# Patient Record
Sex: Female | Born: 1995 | Race: Black or African American | Hispanic: No | Marital: Single | Smoking: Never smoker
Health system: Southern US, Community
[De-identification: ages and names within clinical notes are randomized; demographics above are authoritative.]

## PROBLEM LIST (undated history)

## (undated) DIAGNOSIS — Z8742 Personal history of other diseases of the female genital tract: Secondary | ICD-10-CM

## (undated) DIAGNOSIS — M419 Scoliosis, unspecified: Secondary | ICD-10-CM

## (undated) HISTORY — PX: NO PAST SURGERIES: SHX2092

## (undated) HISTORY — DX: Personal history of other diseases of the female genital tract: Z87.42

---

## 2018-05-23 ENCOUNTER — Encounter: Payer: Self-pay | Admitting: Emergency Medicine

## 2018-05-23 ENCOUNTER — Emergency Department
Admission: EM | Admit: 2018-05-23 | Discharge: 2018-05-23 | Disposition: A | Discharge status: Home / Self Care | Payer: Self-pay | Attending: Emergency Medicine | Admitting: Emergency Medicine

## 2018-05-23 DIAGNOSIS — N939 Abnormal uterine and vaginal bleeding, unspecified: Secondary | ICD-10-CM | POA: Insufficient documentation

## 2018-05-23 LAB — CBC WITH DIFFERENTIAL/PLATELET
BASOS ABS: 0.1 10*3/uL (ref 0–0.1)
Basophils Relative: 1 %
EOS ABS: 0.2 10*3/uL (ref 0–0.7)
EOS PCT: 3 %
HCT: 36 % (ref 35.0–47.0)
Hemoglobin: 12.2 g/dL (ref 12.0–16.0)
LYMPHS PCT: 32 %
Lymphs Abs: 2.1 10*3/uL (ref 1.0–3.6)
MCH: 30 pg (ref 26.0–34.0)
MCHC: 34 g/dL (ref 32.0–36.0)
MCV: 88.2 fL (ref 80.0–100.0)
MONO ABS: 0.6 10*3/uL (ref 0.2–0.9)
Monocytes Relative: 9 %
Neutro Abs: 3.6 10*3/uL (ref 1.4–6.5)
Neutrophils Relative %: 55 %
PLATELETS: 279 10*3/uL (ref 150–440)
RBC: 4.08 MIL/uL (ref 3.80–5.20)
RDW: 15 % — AB (ref 11.5–14.5)
WBC: 6.5 10*3/uL (ref 3.6–11.0)

## 2018-05-23 LAB — COMPREHENSIVE METABOLIC PANEL
ALBUMIN: 3.9 g/dL (ref 3.5–5.0)
ALT: 11 U/L (ref 0–44)
AST: 20 U/L (ref 15–41)
Alkaline Phosphatase: 56 U/L (ref 38–126)
Anion gap: 6 (ref 5–15)
BUN: 7 mg/dL (ref 6–20)
CALCIUM: 9 mg/dL (ref 8.9–10.3)
CHLORIDE: 106 mmol/L (ref 98–111)
CO2: 27 mmol/L (ref 22–32)
CREATININE: 0.75 mg/dL (ref 0.44–1.00)
GFR calc Af Amer: >60 mL/min (ref >60)
GFR calc non Af Amer: >60 mL/min (ref >60)
Glucose, Bld: 84 mg/dL (ref 70–99)
POTASSIUM: 3.8 mmol/L (ref 3.5–5.1)
Sodium: 139 mmol/L (ref 135–145)
TOTAL PROTEIN: 7.7 g/dL (ref 6.5–8.1)
Total Bilirubin: 0.4 mg/dL (ref 0.3–1.2)

## 2018-05-23 LAB — POCT PREGNANCY, URINE: Preg Test, Ur: NEGATIVE

## 2018-05-23 NOTE — ED Notes (Signed)
Upon reassesment of EDP, room found to be empty with no signs of patient returning.

## 2018-05-23 NOTE — ED Triage Notes (Signed)
Patient states that she started her period on Thursday. Patient states that with this period she has had back pain and has had heavier bleeding then normal.

## 2018-05-23 NOTE — ED Provider Notes (Signed)
[facility] Emergency Department Provider Note  ____________________________________________   I have reviewed the triage vital signs and the nursing notes. Where available I have reviewed prior notes and, if possible and indicated, outside hospital notes.    HISTORY  Chief Complaint Vaginal Bleeding    HPI Felicia Avila is a 22 y.o. female  States that she normally has regular menstrual cycles, her last menstrual cycle was August 28.  She states she began to have another extra duration a few days ago which is atypical for her.  She is actually active she has had no vaginal discharge.  It is about the same as a normal.  And nearly gone now but she is concerned because it is irregular.  She wants to know she is pregnant.  She is not using any birth control. Last menstruation was normal.  She has had no significant bleeding this time.  Not passing clots, used a total of 2-3 pads today.  History reviewed. No pertinent past medical history.  There are no active problems to display for this patient.   History reviewed. No pertinent surgical history.  Prior to Admission medications   Not on File    Allergies Patient has no known allergies.  No family history on file.  Social History Social History   Tobacco Use  . Smoking status: Former Research scientist (life sciences)  . Smokeless tobacco: Never Used  Substance Use Topics  . Alcohol use: Yes    Comment: occ  . Drug use: Not Currently    Review of Systems Constitutional: No fever/chills Eyes: No visual changes. ENT: No sore throat. No stiff neck no neck pain Cardiovascular: Denies chest pain. Respiratory: Denies shortness of breath. Gastrointestinal:   no vomiting.  No diarrhea.  No constipation. Genitourinary: Negative for dysuria. Musculoskeletal: Negative lower extremity swelling Skin: Negative for rash. Neurological: Negative for severe headaches, focal weakness or  numbness.   ____________________________________________   PHYSICAL EXAM:  VITAL SIGNS: ED Triage Vitals  Enc Vitals Group     BP 05/23/18 1927 (!) 130/92     Pulse Rate 05/23/18 1927 78     Resp 05/23/18 1927 18     Temp 05/23/18 1927 99.7 F (37.6 C)     Temp Source 05/23/18 1927 Oral     SpO2 05/23/18 1927 98 %     Weight 05/23/18 1928 140 lb (63.5 kg)     Height 05/23/18 1928 5\' 4"  (1.626 m)     Head Circumference --      Peak Flow --      Pain Score 05/23/18 1928 0     Pain Loc --      Pain Edu? --      Excl. in [place]? --     Constitutional: Alert and oriented. Well appearing and in no acute distress. Eyes: Conjunctivae are normal Head: Atraumatic HEENT: No congestion/rhinnorhea. Mucous membranes are moist.  Oropharynx non-erythematous Neck:   Nontender with no meningismus, no masses, no stridor Cardiovascular: Normal rate, regular rhythm. Grossly normal heart sounds.  Good peripheral circulation. Respiratory: Normal respiratory effort.  No retractions. Lungs CTAB. Abdominal: Soft and nontender. No distention. No guarding no rebound Back:  There is no focal tenderness or step off.  there is no midline tenderness there are no lesions noted. there is no CVA tenderness Patient declines Musculoskeletal: No lower extremity tenderness, no upper extremity tenderness. No joint effusions, no DVT signs strong distal pulses no edema Neurologic:  Normal speech and language. No gross focal  neurologic deficits are appreciated.  Skin:  Skin is warm, dry and intact. No rash noted. Psychiatric: Mood and affect are normal. Speech and behavior are normal.  ____________________________________________   LABS (all labs ordered are listed, but only abnormal results are displayed)  Labs Reviewed  CBC WITH DIFFERENTIAL/PLATELET - Abnormal; Notable for the following components:      Result Value   RDW 15.0 (*)    All other components within normal limits  COMPREHENSIVE METABOLIC PANEL   POC URINE PREG, ED  POCT PREGNANCY, URINE    Pertinent labs  results that were available during my care of the patient were reviewed by me and considered in my medical decision making (see chart for details). ____________________________________________  EKG  I personally interpreted any EKGs ordered by me or triage  ____________________________________________  RADIOLOGY  Pertinent labs & imaging results that were available during my care of the patient were reviewed by me and considered in my medical decision making (see chart for details). If possible, patient and/or family made aware of any abnormal findings.  No results found. ____________________________________________    PROCEDURES  Procedure(s) performed: None  Procedures  Critical Care performed: None  ____________________________________________   INITIAL IMPRESSION / ASSESSMENT AND PLAN / ED COURSE  Pertinent labs & imaging results that were available during my care of the patient were reviewed by me and considered in my medical decision making (see chart for details).  Patient here with early vaginal bleeding, hemoglobin and vital signs are reassuring, she is a told her she was not pregnant she states "that I am going to leave now".  I asked her to wait for further conversation and she eloped.  Before she left however I was able to tell her that she does need to secure some means of birth control and follow-up with OB and she voiced understanding of that.  After that, states she is found that she was not pregnant, she elected not to stay for further care and patient eloped.    ____________________________________________   FINAL CLINICAL IMPRESSION(S) / ED DIAGNOSES  Final diagnoses:  None      This chart was dictated using voice recognition software.  Despite best efforts to proofread,  errors can occur which can change meaning.      Schuyler Amor, MD 05/23/18 2041

## 2019-05-26 ENCOUNTER — Emergency Department: Payer: Self-pay

## 2019-05-26 ENCOUNTER — Other Ambulatory Visit: Payer: Self-pay

## 2019-05-26 ENCOUNTER — Encounter: Payer: Self-pay | Admitting: Emergency Medicine

## 2019-05-26 ENCOUNTER — Emergency Department
Admission: EM | Admit: 2019-05-26 | Discharge: 2019-05-26 | Disposition: A | Discharge status: Home / Self Care | Payer: Self-pay | Attending: Emergency Medicine | Admitting: Emergency Medicine

## 2019-05-26 DIAGNOSIS — Z87891 Personal history of nicotine dependence: Secondary | ICD-10-CM | POA: Insufficient documentation

## 2019-05-26 DIAGNOSIS — R1031 Right lower quadrant pain: Secondary | ICD-10-CM | POA: Insufficient documentation

## 2019-05-26 DIAGNOSIS — N83209 Unspecified ovarian cyst, unspecified side: Secondary | ICD-10-CM

## 2019-05-26 DIAGNOSIS — N83202 Unspecified ovarian cyst, left side: Secondary | ICD-10-CM

## 2019-05-26 DIAGNOSIS — N83201 Unspecified ovarian cyst, right side: Secondary | ICD-10-CM

## 2019-05-26 DIAGNOSIS — R102 Pelvic and perineal pain: Secondary | ICD-10-CM

## 2019-05-26 LAB — HCG, QUANTITATIVE, PREGNANCY: hCG, Beta Chain, Quant, S: <1 m[IU]/mL (ref <5)

## 2019-05-26 LAB — URINALYSIS, COMPLETE (UACMP) WITH MICROSCOPIC
Bacteria, UA: NONE SEEN
Bilirubin Urine: NEGATIVE
Glucose, UA: NEGATIVE mg/dL
Hgb urine dipstick: NEGATIVE
Ketones, ur: NEGATIVE mg/dL
Nitrite: NEGATIVE
Protein, ur: NEGATIVE mg/dL
Specific Gravity, Urine: 1.02 (ref 1.005–1.030)
pH: 6 (ref 5.0–8.0)

## 2019-05-26 LAB — COMPREHENSIVE METABOLIC PANEL
ALT: 12 U/L (ref 0–44)
AST: 17 U/L (ref 15–41)
Albumin: 3.9 g/dL (ref 3.5–5.0)
Alkaline Phosphatase: 58 U/L (ref 38–126)
Anion gap: 12 (ref 5–15)
BUN: 11 mg/dL (ref 6–20)
CO2: 26 mmol/L (ref 22–32)
Calcium: 8.9 mg/dL (ref 8.9–10.3)
Chloride: 101 mmol/L (ref 98–111)
Creatinine, Ser: 0.81 mg/dL (ref 0.44–1.00)
GFR calc Af Amer: >60 mL/min (ref >60)
GFR calc non Af Amer: >60 mL/min (ref >60)
Glucose, Bld: 93 mg/dL (ref 70–99)
Potassium: 3.7 mmol/L (ref 3.5–5.1)
Sodium: 139 mmol/L (ref 135–145)
Total Bilirubin: 0.4 mg/dL (ref 0.3–1.2)
Total Protein: 7.9 g/dL (ref 6.5–8.1)

## 2019-05-26 LAB — CBC
HCT: 37.1 % (ref 36.0–46.0)
Hemoglobin: 11.6 g/dL — ABNORMAL LOW (ref 12.0–15.0)
MCH: 27.7 pg (ref 26.0–34.0)
MCHC: 31.3 g/dL (ref 30.0–36.0)
MCV: 88.5 fL (ref 80.0–100.0)
Platelets: 350 10*3/uL (ref 150–400)
RBC: 4.19 MIL/uL (ref 3.87–5.11)
RDW: 14.6 % (ref 11.5–15.5)
WBC: 7.3 10*3/uL (ref 4.0–10.5)
nRBC: 0 % (ref 0.0–0.2)

## 2019-05-26 LAB — POCT PREGNANCY, URINE: Preg Test, Ur: NEGATIVE

## 2019-05-26 LAB — LIPASE, BLOOD: Lipase: 36 U/L (ref 11–51)

## 2019-05-26 MED ORDER — IBUPROFEN 800 MG PO TABS: 800.0000 mg | ORAL_TABLET | Freq: Three times a day (TID) | ORAL | 0 refills | Status: DC | PRN

## 2019-05-26 MED ORDER — ONDANSETRON HCL 4 MG/2ML IJ SOLN
4.0000 mg | Freq: Once | INTRAMUSCULAR | Status: AC
  Administered 2019-05-26: 4 mg via INTRAVENOUS
  Filled 2019-05-26: qty 2

## 2019-05-26 MED ORDER — MORPHINE SULFATE (PF) 2 MG/ML IV SOLN: 2.0000 mg | Freq: Once | INTRAVENOUS | Status: DC

## 2019-05-26 MED ORDER — IOHEXOL 300 MG/ML  SOLN
75.0000 mL | Freq: Once | INTRAMUSCULAR | Status: AC | PRN
  Administered 2019-05-26: 75 mL via INTRAVENOUS

## 2019-05-26 MED ORDER — MORPHINE SULFATE (PF) 4 MG/ML IV SOLN
4.0000 mg | Freq: Once | INTRAVENOUS | Status: AC
  Administered 2019-05-26: 4 mg via INTRAVENOUS
  Filled 2019-05-26: qty 1

## 2019-05-26 NOTE — ED Triage Notes (Signed)
Pt reports RLQ abd pain since 8 this am. Pt denies NVD. Pt reports the pain is sharp and constant. Pt reports pain increases with deep breaths. Pt denies fevers.

## 2019-05-26 NOTE — ED Notes (Signed)
Pt states that abdominal pain started this morning around 0800, states pain is located on lower right side of abdomen. Describes the pain as a sharp pain. Denies NVD.

## 2019-05-26 NOTE — ED Provider Notes (Signed)
[facility] Emergency Department Provider Note  ____________________________________________   First MD Initiated Contact with Patient 05/26/19 1043     (approximate)  I have reviewed the triage vital signs and the nursing notes.   HISTORY  Chief Complaint Abdominal Pain  HPI Felicia Avila is a 23 y.o. female who presents to the ER for right lower quadrant abdominal pain since 8:00 this morning.  She denies nausea, vomiting or diarrhea.  Patient reports the pain is sharp and constant and worsens with a deep breath.  Pain is 5 out of 10.     History reviewed. No pertinent past medical history.  There are no active problems to display for this patient.   History reviewed. No pertinent surgical history.  Prior to Admission medications   Not on File    Allergies Patient has no known allergies.  No family history on file.  Social History Social History   Tobacco Use  . Smoking status: Former Research scientist (life sciences)  . Smokeless tobacco: Never Used  Substance Use Topics  . Alcohol use: Yes    Comment: occ  . Drug use: Not Currently    Review of Systems  Constitutional: No fever/chills Cardiovascular: Denies chest pain. Respiratory: Denies shortness of breath. Gastrointestinal: Positive for abdominal pain Genitourinary: Negative for dysuria. Musculoskeletal: Negative for back pain. Skin: Negative for rash. Neurological: Negative for headaches, focal weakness or numbness.  ____________________________________________   PHYSICAL EXAM:  VITAL SIGNS: ED Triage Vitals  Enc Vitals Group     BP 05/26/19 1010 (!) 124/91     Pulse Rate 05/26/19 1010 73     Resp 05/26/19 1010 20     Temp 05/26/19 1010 99.1 F (37.3 C)     Temp Source 05/26/19 1010 Oral     SpO2 05/26/19 1010 99 %     Weight 05/26/19 1007 136 lb (61.7 kg)     Height 05/26/19 1007 5\' 4"  (1.626 m)     Head Circumference --      Peak Flow --      Pain Score 05/26/19 1007 5   Pain Loc --      Pain Edu? --      Excl. in [place]? --    Constitutional: Alert and oriented. Well appearing and in no acute distress. Eyes: Conjunctivae are normal. PERRL. EOMI. Head: Atraumatic. Nose: No congestion/rhinnorhea. Mouth/Throat: Mucous membranes are moist.  Oropharynx non-erythematous. Neck: No stridor.   Cardiovascular: Normal rate, regular rhythm. Grossly normal heart sounds.  Good peripheral circulation. Respiratory: Normal respiratory effort.  No retractions. Lungs CTAB. Gastrointestinal: Soft and nontender. No distention. No abdominal bruits. No CVA tenderness. Musculoskeletal: No lower extremity tenderness nor edema.  No joint effusions. Neurologic:  Normal speech and language. No gross focal neurologic deficits are appreciated. No gait instability. Skin:  Skin is warm, dry and intact. No rash noted. Psychiatric: Mood and affect are normal. Speech and behavior are normal.  ____________________________________________   LABS (all labs ordered are listed, but only abnormal results are displayed)  Labs Reviewed  CBC - Abnormal; Notable for the following components:      Result Value   Hemoglobin 11.6 (*)    All other components within normal limits  URINALYSIS, COMPLETE (UACMP) WITH MICROSCOPIC - Abnormal; Notable for the following components:   Color, Urine YELLOW (*)    APPearance HAZY (*)    Leukocytes,Ua SMALL (*)    All other components within normal limits  CHLAMYDIA/NGC RT PCR ([facility] ONLY)  LIPASE, BLOOD  COMPREHENSIVE METABOLIC PANEL  POC URINE PREG, ED  POCT PREGNANCY, URINE   ___________________________________________  RADIOLOGY  Official radiology report(s): No results found. CT the abdomen pelvis with contrast  IMPRESSION:  1. Recently collapsed right ovarian cyst. Fluid tracks from the  right adnexa along the rightward aspect of the upper cul-de-sac.   2. Suspect recent cyst/follicle leakage on the left with fluid  tracking from  an apparent follicle on the left into the leftward  aspect of the cul-de-sac.   3. Appendix appears normal. No evident abscess in the abdomen or  pelvis. No bowel obstruction.   4. No evident renal or ureteral calculus. No hydronephrosis. Urinary  bladder wall thickness within normal limits.  Pelvic US  IMPRESSION:  1. Negative for adnexal torsion.  2. Multiple mildly prominent right ovarian cysts versus follicles  with a small amount of adjacent mildly complex free fluid. Findings  suggestive of a recently ruptured hemorrhagic cyst. No specific  imaging follow-up of this finding is recommended.  ____________________________________________   INITIAL IMPRESSION / ASSESSMENT AND PLAN / ED COURSE Appendicitis, renal colic, UTI, pyelonephritis, ovarian cyst, torsion   ____________________________________________   FINAL CLINICAL IMPRESSION(S) / ED DIAGNOSES  Abdominal pain  Plan: Patient presented for right lower quadrant abdominal pain, labs and initial work-up was unremarkable.  CT of the abdomen pelvis was performed as well as an ultrasound which was indicative of a ruptured ovarian cyst.  I have discussed with GYN, Dr. Gilman Schmidt, she is cleared for outpatient follow-up.      Note:  This document was prepared using Dragon voice recognition software and may include unintentional dictation errors.    Earleen Newport, MD 05/26/19 [phone]

## 2019-06-16 ENCOUNTER — Ambulatory Visit (LOCAL_COMMUNITY_HEALTH_CENTER): Payer: Self-pay

## 2019-06-16 ENCOUNTER — Other Ambulatory Visit: Payer: Self-pay

## 2019-06-16 VITALS — Ht 63.5 in

## 2019-06-16 DIAGNOSIS — Z3202 Encounter for pregnancy test, result negative: Secondary | ICD-10-CM

## 2019-06-16 LAB — PREGNANCY, URINE: Preg Test, Ur: NEGATIVE

## 2019-06-16 MED ORDER — MULTIVITAMINS PO CAPS: 1.0000 | ORAL_CAPSULE | Freq: Every day | ORAL | 0 refills | Status: DC

## 2019-09-11 ENCOUNTER — Other Ambulatory Visit: Payer: Self-pay

## 2019-09-11 ENCOUNTER — Emergency Department
Admission: EM | Admit: 2019-09-11 | Discharge: 2019-09-11 | Disposition: A | Discharge status: Home / Self Care | Payer: Self-pay | Attending: Emergency Medicine | Admitting: Emergency Medicine

## 2019-09-11 ENCOUNTER — Emergency Department: Payer: Self-pay

## 2019-09-11 ENCOUNTER — Encounter: Payer: Self-pay | Admitting: Emergency Medicine

## 2019-09-11 DIAGNOSIS — Z87891 Personal history of nicotine dependence: Secondary | ICD-10-CM | POA: Insufficient documentation

## 2019-09-11 DIAGNOSIS — X500XXA Overexertion from strenuous movement or load, initial encounter: Secondary | ICD-10-CM | POA: Insufficient documentation

## 2019-09-11 DIAGNOSIS — Y9289 Other specified places as the place of occurrence of the external cause: Secondary | ICD-10-CM | POA: Insufficient documentation

## 2019-09-11 DIAGNOSIS — M545 Low back pain, unspecified: Secondary | ICD-10-CM

## 2019-09-11 DIAGNOSIS — Y9389 Activity, other specified: Secondary | ICD-10-CM | POA: Insufficient documentation

## 2019-09-11 DIAGNOSIS — N390 Urinary tract infection, site not specified: Secondary | ICD-10-CM | POA: Insufficient documentation

## 2019-09-11 LAB — URINALYSIS, COMPLETE (UACMP) WITH MICROSCOPIC
Bacteria, UA: NONE SEEN
Bilirubin Urine: NEGATIVE
Glucose, UA: NEGATIVE mg/dL
Hgb urine dipstick: NEGATIVE
Ketones, ur: NEGATIVE mg/dL
Nitrite: NEGATIVE
Protein, ur: NEGATIVE mg/dL
Specific Gravity, Urine: 1.02 (ref 1.005–1.030)
pH: 6 (ref 5.0–8.0)

## 2019-09-11 LAB — POCT PREGNANCY, URINE: Preg Test, Ur: NEGATIVE

## 2019-09-11 MED ORDER — CEPHALEXIN 500 MG PO CAPS: 500.0000 mg | ORAL_CAPSULE | Freq: Three times a day (TID) | ORAL | 0 refills | Status: DC

## 2019-09-11 MED ORDER — NAPROXEN 500 MG PO TABS: 500.0000 mg | ORAL_TABLET | Freq: Two times a day (BID) | ORAL | 0 refills | Status: DC

## 2019-09-11 NOTE — Discharge Instructions (Signed)
Follow-up with your primary care provider if any continued problems.  Begin taking the antibiotic that was sent to your pharmacy.  Take the entire course of antibiotics and drink lots of fluids.  You may take Tylenol or ibuprofen as needed for back pain as the urinary tract infection is most likely the source of your back pain.  Turn to the emergency department if any sudden changes such as nausea and vomiting or inability to take the antibiotic.

## 2019-09-11 NOTE — ED Notes (Signed)
See triage note  Presents with a 2 month hx of back pain  States pain is to center of back  Moving down spine  Denies any injury   Ambulates well to treatment room

## 2019-09-11 NOTE — ED Triage Notes (Signed)
Here for generalized back pain X 2 months. Worse when laying flat.  Was sleeping on broken mattress until recently.  Also lifts heavy boxes at work. NAD. Ambulatory. Denies urinary sx

## 2019-09-11 NOTE — ED Provider Notes (Signed)
[facility] Emergency Department Provider Note   ____________________________________________   First MD Initiated Contact with Patient 09/11/19 1054     (approximate)  I have reviewed the triage vital signs and the nursing notes.   HISTORY  Chief Complaint Back Pain  HPI Felicia Avila is a 24 y.o. female presents to the ED with complaint of low back pain for 2 months.  Patient states that pain is in the center of her back.  She denies any paresthesias or incontinence of bowel or bladder.  She denies any injury to her back.  She states that she has been sleeping on "a broken mattress" which she believes is made her back much worse.  She also lifts heavy boxes at work.  She denies any urinary symptoms or previous back injuries.  She has been taking ibuprofen infrequently.  Currently she rates her pain as a 4 out of 10.       Past Medical History:  Diagnosis Date   Hx of ovarian cyst     There are no problems to display for this patient.   History reviewed. No pertinent surgical history.  Prior to Admission medications   Medication Sig Start Date End Date Taking? Authorizing Provider  cephALEXin (KEFLEX) 500 MG capsule Take 1 capsule (500 mg total) by mouth 3 (three) times daily. 09/11/19   Johnn Hai, PA-C  naproxen (NAPROSYN) 500 MG tablet Take 1 tablet (500 mg total) by mouth 2 (two) times daily with a meal. 09/11/19   Johnn Hai, PA-C    Allergies Patient has no known allergies.  History reviewed. No pertinent family history.  Social History Social History   Tobacco Use   Smoking status: Former Smoker   Smokeless tobacco: Never Used  Substance Use Topics   Alcohol use: Yes    Comment: occasionally   Drug use: Never    Review of Systems Constitutional: No fever/chills Cardiovascular: Denies chest pain. Respiratory: Denies shortness of breath. Gastrointestinal: No abdominal pain.  No nausea, no vomiting.    Genitourinary: Negative for dysuria. Musculoskeletal: Positive for low back pain. Skin: Negative for rash. Neurological: Negative for headaches, focal weakness or numbness. ____________________________________________   PHYSICAL EXAM:  VITAL SIGNS: ED Triage Vitals  Enc Vitals Group     BP 09/11/19 1037 124/75     Pulse Rate 09/11/19 1037 73     Resp 09/11/19 1037 20     Temp 09/11/19 1037 98 F (36.7 C)     Temp Source 09/11/19 1037 Oral     SpO2 09/11/19 1037 100 %     Weight 09/11/19 1037 153 lb (69.4 kg)     Height 09/11/19 1037 5' 3.5" (1.613 m)     Head Circumference --      Peak Flow --      Pain Score 09/11/19 1041 4     Pain Loc --      Pain Edu? --      Excl. in [place]? --    Constitutional: Alert and oriented. Well appearing and in no acute distress. Eyes: Conjunctivae are normal.  Head: Atraumatic. Neck: No stridor.   Cardiovascular: Normal rate, regular rhythm. Grossly normal heart sounds.  Good peripheral circulation. Respiratory: Normal respiratory effort.  No retractions. Lungs CTAB. Gastrointestinal: Soft and nontender. No distention. Musculoskeletal: Examination of the back there is no gross deformity and no evidence of injury.  Patient has guarded movement secondary to pain.  She is generalized tenderness bilateral lumbar area.  There is some generalized point tenderness on palpation of the lumbar spine without step-offs.  Good muscle strength bilaterally. Neurologic:  Normal speech and language. No gross focal neurologic deficits are appreciated.  Reflexes 2+ bilaterally.  No gait instability. Skin:  Skin is warm, dry and intact. No rash noted. Psychiatric: Mood and affect are normal. Speech and behavior are normal.  ____________________________________________   LABS (all labs ordered are listed, but only abnormal results are displayed)  Labs Reviewed  URINALYSIS, COMPLETE (UACMP) WITH MICROSCOPIC - Abnormal; Notable for the following components:       Result Value   Color, Urine YELLOW (*)    APPearance HAZY (*)    Leukocytes,Ua MODERATE (*)    All other components within normal limits  POC URINE PREG, ED  POCT PREGNANCY, URINE    RADIOLOGY  Official radiology report(s): DG Lumbar Spine 2-3 Views  Result Date: 09/11/2019 CLINICAL DATA:  Pain for approximately 2 months EXAM: LUMBAR SPINE - 2-3 VIEW COMPARISON:  None. FINDINGS: Frontal, lateral, and spot lumbosacral lateral images were obtained. There are 5 non-rib-bearing lumbar type vertebral bodies. There is slight lower lumbar levoscoliosis. No fracture or spondylolisthesis. Disc spaces appear normal. There is spina bifida occulta at L1. IMPRESSION: Slight lower lumbar levoscoliosis. Spina bifida occulta at L1. No fracture or spondylolisthesis. No appreciable arthropathy. Electronically Signed   By: Lowella Grip III M.D.   On: 09/11/2019 13:47    ____________________________________________   PROCEDURES  Procedure(s) performed (including Critical Care):  Procedures   ____________________________________________   INITIAL IMPRESSION / ASSESSMENT AND PLAN / ED COURSE  As part of my medical decision making, I reviewed the following data within the electronic MEDICAL RECORD NUMBER Notes from prior ED visits and [place] Controlled Substance Database  Felicia Avila was evaluated in Emergency Department on 09/11/2019 for the symptoms described in the history of present illness. She was evaluated in the context of the global COVID-19 pandemic, which necessitated consideration that the patient might be at risk for infection with the SARS-CoV-2 virus that causes COVID-19. Institutional protocols and algorithms that pertain to the evaluation of patients at risk for COVID-19 are in a state of rapid change based on information released by regulatory bodies including the CDC and federal and state organizations. These policies and algorithms were followed during the patient's care in the  ED.  24 year old female presents to the ED with complaint of 18-month back pain that she attributes to sleeping on a "broken mattress" and lifting heavy boxes at work.  She denies any urinary symptoms.  She has not been take any over-the-counter medication that gives her relief.  Urinalysis was obtained to rule out pregnancy prior to x-ray however urine was cloudy and sent to the lab.  Patient has a urinary tract infection and was made aware.  We also discussed findings of her x-ray.  Patient is to increase fluids.  A prescription for naproxen 500 mg twice daily with food and Keflex 500 mg 3 times daily was written to treat her urinary tract infection and back pain.  Patient is to follow-up with her PCP or return to the emergency department if any severe worsening of her symptoms.  ____________________________________________   FINAL CLINICAL IMPRESSION(S) / ED DIAGNOSES  Final diagnoses:  Acute urinary tract infection  Acute bilateral low back pain without sciatica     ED Discharge Orders         Ordered    cephALEXin (KEFLEX) 500 MG capsule  3 times daily  09/11/19 1446    naproxen (NAPROSYN) 500 MG tablet  2 times daily with meals     09/11/19 1446           Note:  This document was prepared using Dragon voice recognition software and may include unintentional dictation errors.    Johnn Hai, PA-C 09/11/19 1533    Harvest Dark, MD 09/11/19 [phone]

## 2020-01-04 ENCOUNTER — Encounter: Payer: Self-pay | Admitting: Emergency Medicine

## 2020-01-04 ENCOUNTER — Other Ambulatory Visit: Payer: Self-pay

## 2020-01-04 ENCOUNTER — Emergency Department
Admission: EM | Admit: 2020-01-04 | Discharge: 2020-01-04 | Disposition: A | Discharge status: Home / Self Care | Payer: Self-pay | Attending: Emergency Medicine | Admitting: Emergency Medicine

## 2020-01-04 DIAGNOSIS — J0100 Acute maxillary sinusitis, unspecified: Secondary | ICD-10-CM | POA: Insufficient documentation

## 2020-01-04 DIAGNOSIS — I889 Nonspecific lymphadenitis, unspecified: Secondary | ICD-10-CM | POA: Insufficient documentation

## 2020-01-04 DIAGNOSIS — Z87891 Personal history of nicotine dependence: Secondary | ICD-10-CM | POA: Insufficient documentation

## 2020-01-04 MED ORDER — AMOXICILLIN 500 MG PO CAPS: 500.0000 mg | ORAL_CAPSULE | Freq: Three times a day (TID) | ORAL | 0 refills | Status: DC

## 2020-01-04 NOTE — ED Provider Notes (Signed)
[facility] Emergency Department Provider Note  ____________________________________________   First MD Initiated Contact with Patient 01/04/20 1524     (approximate)  I have reviewed the triage vital signs and the nursing notes.   HISTORY  Chief Complaint Otalgia    HPI Felicia Avila is a 24 y.o. female presents emergency department complaint of pain behind the right ear for approximately 2 years.  Symptoms have worsened over the last few days and patient also has sinus congestion.  No fever or chills.  No pus or drainage from the site.  No headache.    Past Medical History:  Diagnosis Date  . Hx of ovarian cyst     There are no problems to display for this patient.   History reviewed. No pertinent surgical history.  Prior to Admission medications   Medication Sig Start Date End Date Taking? Authorizing Provider  amoxicillin (AMOXIL) 500 MG capsule Take 1 capsule (500 mg total) by mouth 3 (three) times daily. 01/04/20   Versie Starks, PA-C    Allergies Patient has no known allergies.  History reviewed. No pertinent family history.  Social History Social History   Tobacco Use  . Smoking status: Former Research scientist (life sciences)  . Smokeless tobacco: Never Used  Substance Use Topics  . Alcohol use: Yes    Comment: occasionally  . Drug use: Never    Review of Systems  Constitutional: No fever/chills Eyes: No visual changes. ENT: No sore throat.  Positive swollen area behind the right ear Respiratory: Denies cough Cardiovascular: Denies chest pain Gastrointestinal: Denies abdominal pain Genitourinary: Negative for dysuria. Musculoskeletal: Negative for back pain. Skin: Negative for rash. Psychiatric: no mood changes,     ____________________________________________   PHYSICAL EXAM:  VITAL SIGNS: ED Triage Vitals  Enc Vitals Group     BP 01/04/20 1452 121/77     Pulse Rate 01/04/20 1452 61     Resp 01/04/20 1452 18     Temp  01/04/20 1452 98.5 F (36.9 C)     Temp Source 01/04/20 1452 Oral     SpO2 01/04/20 1452 94 %     Weight 01/04/20 1452 146 lb (66.2 kg)     Height 01/04/20 1452 5\' 3"  (1.6 m)     Head Circumference --      Peak Flow --      Pain Score 01/04/20 1511 0     Pain Loc --      Pain Edu? --      Excl. in [place]? --     Constitutional: Alert and oriented. Well appearing and in no acute distress. Eyes: Conjunctivae are normal.  Head: Atraumatic. Ears: Postauricular nodes noted, TMs clear bilaterally,  Nose: Positive congestion/rhinnorhea. Mouth/Throat: Mucous membranes are moist.   Neck:  supple no lymphadenopathy noted Cardiovascular: Normal rate, regular rhythm. Heart sounds are normal Respiratory: Normal respiratory effort.  No retractions, lungs c t a  GU: deferred Musculoskeletal: FROM all extremities, warm and well perfused Neurologic:  Normal speech and language.  Skin:  Skin is warm, dry and intact. No rash noted. Psychiatric: Mood and affect are normal. Speech and behavior are normal.  ____________________________________________   LABS (all labs ordered are listed, but only abnormal results are displayed)  Labs Reviewed - No data to display ____________________________________________   ____________________________________________  RADIOLOGY    ____________________________________________   PROCEDURES  Procedure(s) performed: No  Procedures    ____________________________________________   INITIAL IMPRESSION / ASSESSMENT AND PLAN / ED COURSE  Pertinent  labs & imaging results that were available during my care of the patient were reviewed by me and considered in my medical decision making (see chart for details).   Patient is 23 year old female presents emergency department with complaints of swollen area behind the right ear along with sinus pressure.  See HPI  Physical exam shows patient to appear well.  The swollen area behind her ear is a swollen lymph  node, the patient also has a cartilage piercing that is rubbing against the area.  No mastoid tenderness.  Remainder the exam is unremarkable  Did explain the findings to the patient.  Due to it being a singular lymph nodes as well I do feel that is due to the sinus drainage and piercing.  She was given a prescription for amoxicillin.  She is to follow-up with ENT if the lymph node does not decrease in side after taking antibiotic.  Return emergency department worsening.  Is given a work note was discharged stable condition.    Felicia Avila was evaluated in Emergency Department on 01/04/2020 for the symptoms described in the history of present illness. She was evaluated in the context of the global COVID-19 pandemic, which necessitated consideration that the patient might be at risk for infection with the SARS-CoV-2 virus that causes COVID-19. Institutional protocols and algorithms that pertain to the evaluation of patients at risk for COVID-19 are in a state of rapid change based on information released by regulatory bodies including the CDC and federal and state organizations. These policies and algorithms were followed during the patient's care in the ED.   As part of my medical decision making, I reviewed the following data within the [place] notes reviewed and incorporated, Old chart reviewed, Notes from prior ED visits and [place] Controlled Substance Database  ____________________________________________   FINAL CLINICAL IMPRESSION(S) / ED DIAGNOSES  Final diagnoses:  Acute maxillary sinusitis, recurrence not specified  Lymphadenitis      NEW MEDICATIONS STARTED DURING THIS VISIT:  New Prescriptions   AMOXICILLIN (AMOXIL) 500 MG CAPSULE    Take 1 capsule (500 mg total) by mouth 3 (three) times daily.     Note:  This document was prepared using Dragon voice recognition software and may include unintentional dictation errors.    Versie Starks, PA-C  01/04/20 1551    Vanessa Chackbay, MD 01/04/20 [phone]

## 2020-01-04 NOTE — ED Triage Notes (Signed)
Pt from home via POV. St intermittent sharp pain behind right ear for over two years but pain has gotten worse over the last few days. St congested/HA since Saturday. Taken sudafed OTC with some relief.  Denies fever. NAD noted at this time.

## 2020-01-04 NOTE — ED Notes (Signed)
Pt reports pain behind the right ear x 2 years. Pt reports pain intermittent and severe. Pt denies any injury or pain in the inner ear. Pt does have congestion at the present time.

## 2020-01-04 NOTE — Discharge Instructions (Signed)
Follow-up with your regular doctor if not improving to 3 days.  Return emergency department worsening.  If the lymph node does not decrease in size after the antibiotics please call ENT for an appointment.

## 2020-02-01 IMAGING — CT CT ABD-PELV W/ CM
2 of 4 series · 15 of 46 positions shown, 17 images · IV contrast (APPLIED)
Comparison: None.

CLINICAL DATA: Right-sided abdominal pain

EXAM:
CT ABDOMEN AND PELVIS WITH CONTRAST
TECHNIQUE: Multidetector CT imaging of the abdomen and pelvis was performed
using the standard protocol following bolus administration of
intravenous contrast.
CONTRAST:  75mL OMNIPAQUE IOHEXOL 300 MG/ML  SOLN

[Series 2: axial st · axial · 0.59mm/px · z∈[-787,-432]mm · 12 of 82 slices shown, 14 images]
[im 7/82  soft-tissue]
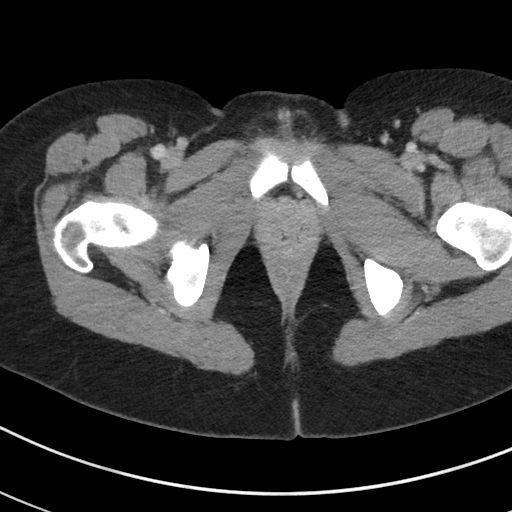
[im 7/82  bone]
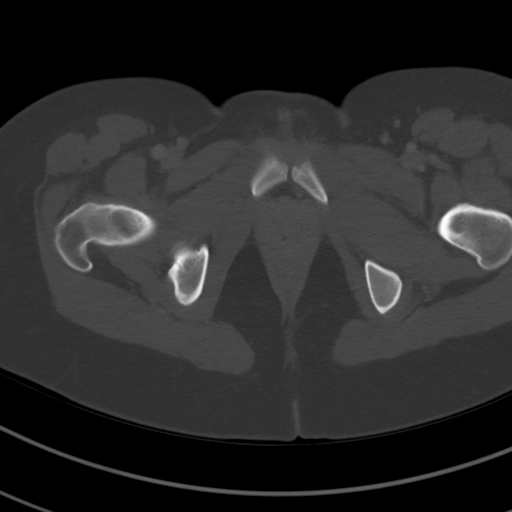
[im 13/82  soft-tissue]
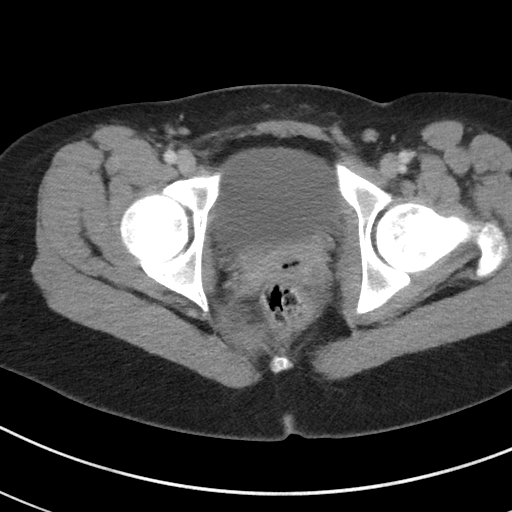
[im 20/82  soft-tissue]
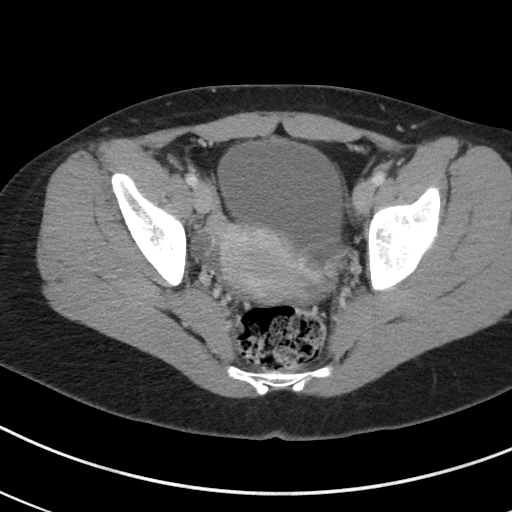
[im 26/82  soft-tissue]
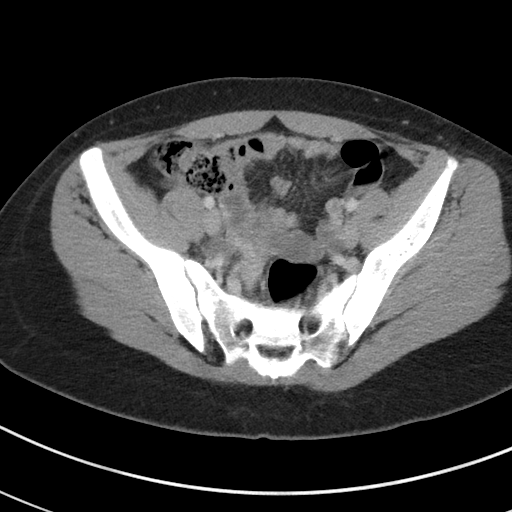
[im 33/82  soft-tissue]
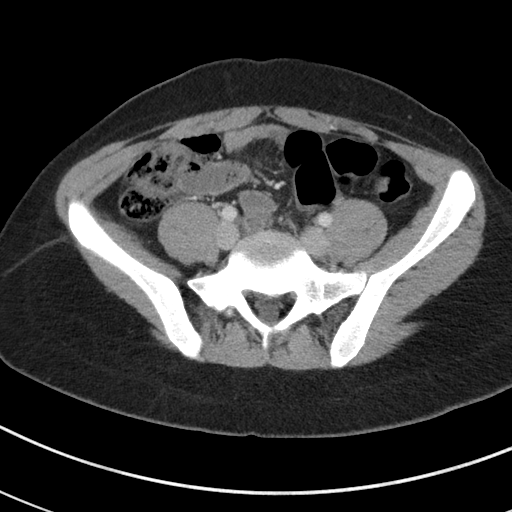
[im 39/82  soft-tissue]
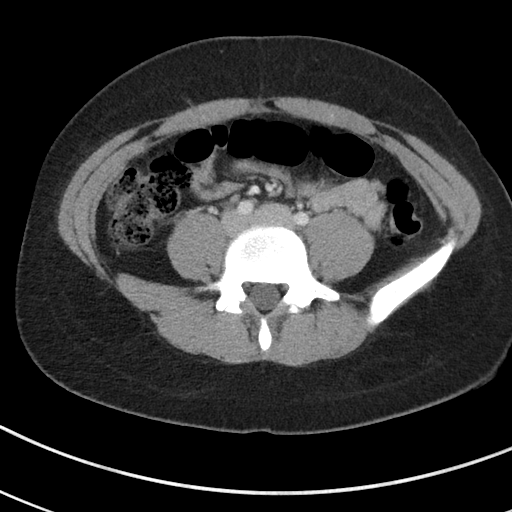
[im 46/82  soft-tissue]
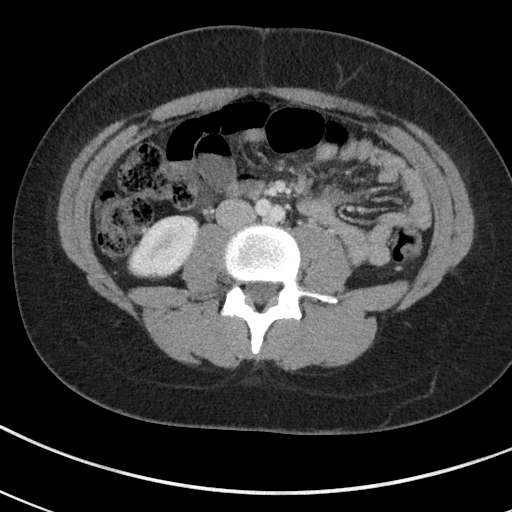
[im 52/82  soft-tissue]
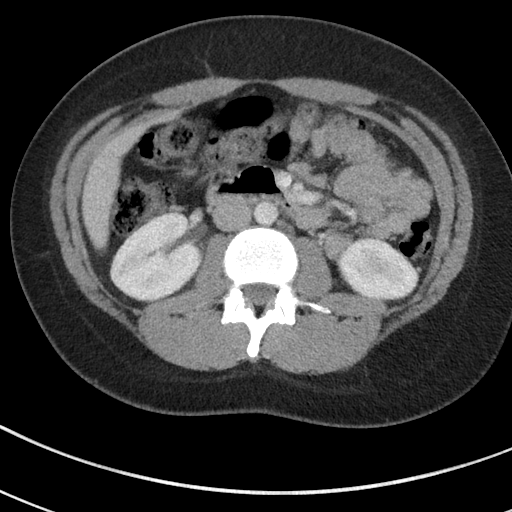
[im 59/82  soft-tissue]
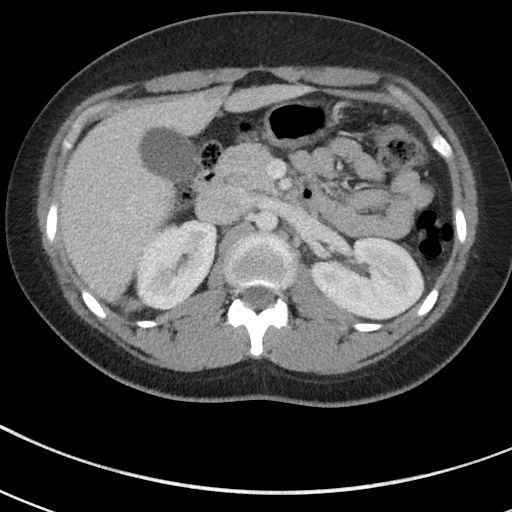
[im 59/82  bone]
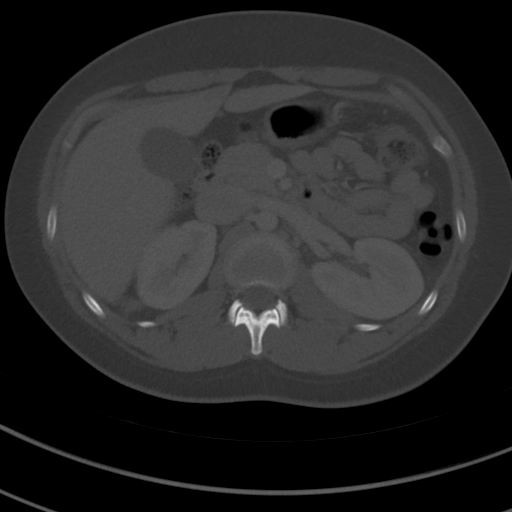
[im 65/82  soft-tissue]
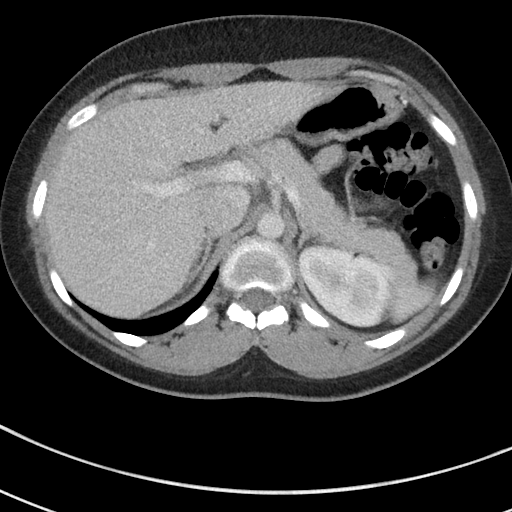
[im 72/82  soft-tissue]
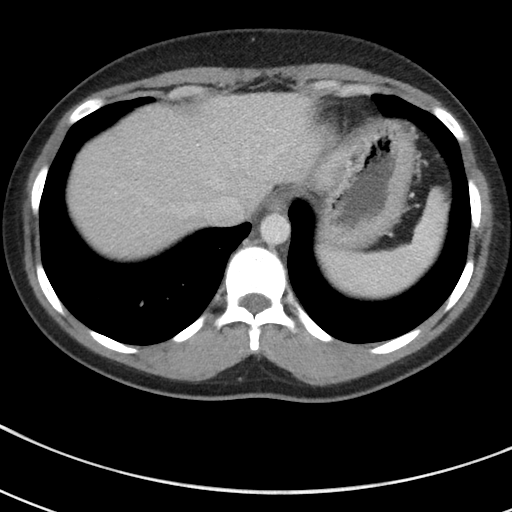
[im 78/82  soft-tissue]
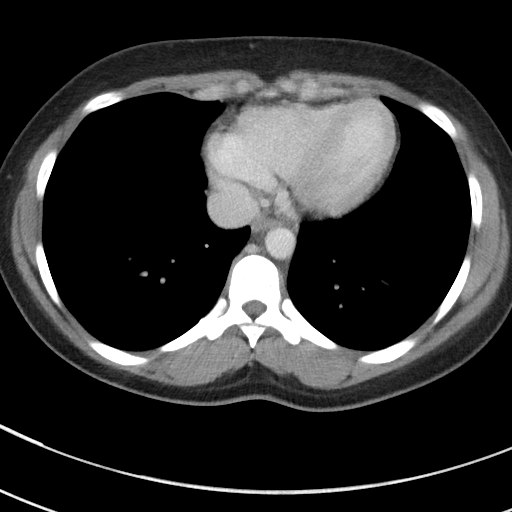

[Series 5: coronal st · coronal · 0.68mm/px · 3 of 86 slices shown]
[im 29/86  soft-tissue]
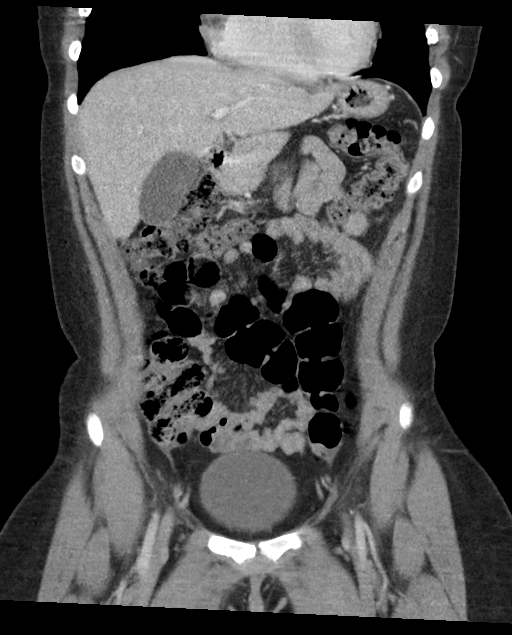
[im 38/86  soft-tissue]
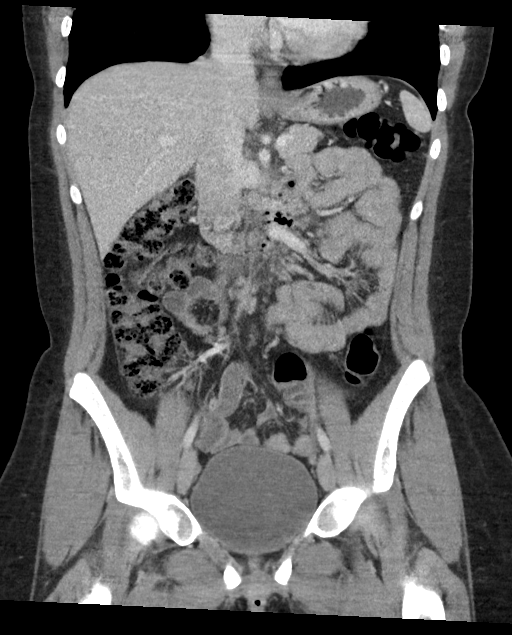
[im 48/86  soft-tissue]
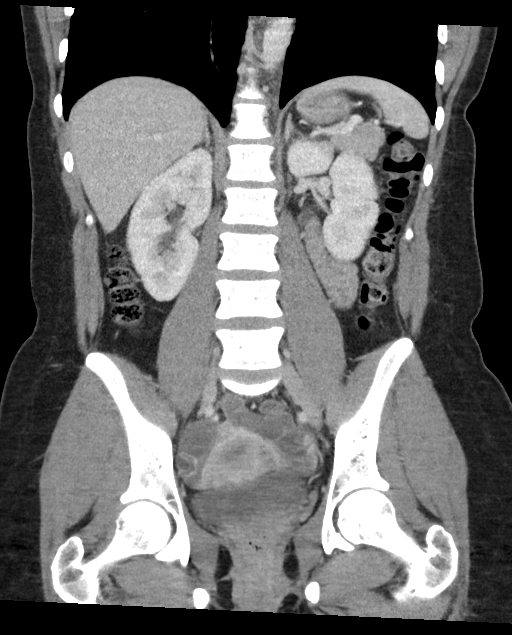

[15 of 46 positions shown; findings below may reference images not displayed]

FINDINGS: Lower chest: Lung bases are clear.

Hepatobiliary: No focal liver lesions are evident. Gallbladder wall
is not appreciably thickened. There is no biliary duct dilatation.

Pancreas: There is no pancreatic mass or inflammatory focus.

Spleen: No splenic lesions are evident.

Adrenals/Urinary Tract: Adrenals bilaterally appear unremarkable.
Kidneys bilaterally show no evident mass or hydronephrosis on either
side. There is no renal or ureteral calculus on either side. Urinary
bladder is midline with wall thickness within normal limits.

Stomach/Bowel: There is no appreciable bowel wall or mesenteric
thickening. There is no evident bowel obstruction. Terminal ileum
appears unremarkable. There is no demonstrable free air or portal
venous air.

Vascular/Lymphatic: There is no abdominal aortic aneurysm. No
vascular lesions are evident in the abdomen or pelvis. Major venous
structures appear patent. There is no adenopathy in the abdomen or
pelvis.

Reproductive: The uterus is anteverted. There is evidence of a
collapsed right ovarian cyst. This cyst shows peripheral wall
enhancement consistent with the recent collapse. It measures 1.8 x
1.6 cm in size. There is fluid surrounding this collapsed cyst with
fluid tracking from the right adnexa inferiorly. There is a presumed
follicle in the left ovary measuring 1.9 x 1.9 cm. There is a small
amount of fluid immediately adjacent to this cyst tracking into the
leftward aspect of the cul-de-sac, suggesting recent cyst leakage on
the left as well.

Other: Appendix appears normal. No periappendiceal region
inflammation. No evident abscess in the abdomen or pelvis. No
ascites beyond the mild fluid in the cul-de-sac region.

Musculoskeletal: No blastic or lytic bone lesions. No intramuscular
or abdominal wall lesions are evident.
IMPRESSION: 1. Recently collapsed right ovarian cyst. Fluid tracks from the
right adnexa along the rightward aspect of the upper cul-de-sac.

2. Suspect recent cyst/follicle leakage on the left with fluid
tracking from an apparent follicle on the left into the leftward
aspect of the cul-de-sac.

3. Appendix appears normal. No evident abscess in the abdomen or
pelvis. No bowel obstruction.

4. No evident renal or ureteral calculus. No hydronephrosis. Urinary
bladder wall thickness within normal limits.

## 2020-03-10 ENCOUNTER — Other Ambulatory Visit: Payer: Self-pay

## 2020-03-10 ENCOUNTER — Emergency Department: Payer: Self-pay

## 2020-03-10 ENCOUNTER — Emergency Department
Admission: EM | Admit: 2020-03-10 | Discharge: 2020-03-10 | Disposition: A | Discharge status: Home / Self Care | Payer: Self-pay | Attending: Emergency Medicine | Admitting: Emergency Medicine

## 2020-03-10 ENCOUNTER — Encounter: Payer: Self-pay | Admitting: Emergency Medicine

## 2020-03-10 DIAGNOSIS — R0981 Nasal congestion: Secondary | ICD-10-CM | POA: Insufficient documentation

## 2020-03-10 DIAGNOSIS — Z87891 Personal history of nicotine dependence: Secondary | ICD-10-CM | POA: Insufficient documentation

## 2020-03-10 DIAGNOSIS — N938 Other specified abnormal uterine and vaginal bleeding: Secondary | ICD-10-CM

## 2020-03-10 DIAGNOSIS — N939 Abnormal uterine and vaginal bleeding, unspecified: Secondary | ICD-10-CM | POA: Insufficient documentation

## 2020-03-10 DIAGNOSIS — R0789 Other chest pain: Secondary | ICD-10-CM | POA: Insufficient documentation

## 2020-03-10 DIAGNOSIS — R0602 Shortness of breath: Secondary | ICD-10-CM | POA: Insufficient documentation

## 2020-03-10 DIAGNOSIS — R05 Cough: Secondary | ICD-10-CM | POA: Insufficient documentation

## 2020-03-10 DIAGNOSIS — R079 Chest pain, unspecified: Secondary | ICD-10-CM

## 2020-03-10 LAB — CBC
HCT: 36 % (ref 36.0–46.0)
Hemoglobin: 11.5 g/dL — ABNORMAL LOW (ref 12.0–15.0)
MCH: 27.8 pg (ref 26.0–34.0)
MCHC: 31.9 g/dL (ref 30.0–36.0)
MCV: 87 fL (ref 80.0–100.0)
Platelets: 320 10*3/uL (ref 150–400)
RBC: 4.14 MIL/uL (ref 3.87–5.11)
RDW: 15.4 % (ref 11.5–15.5)
WBC: 7.1 10*3/uL (ref 4.0–10.5)
nRBC: 0 % (ref 0.0–0.2)

## 2020-03-10 LAB — POCT PREGNANCY, URINE: Preg Test, Ur: NEGATIVE

## 2020-03-10 LAB — BASIC METABOLIC PANEL
Anion gap: 5 (ref 5–15)
BUN: 13 mg/dL (ref 6–20)
CO2: 26 mmol/L (ref 22–32)
Calcium: 8.8 mg/dL — ABNORMAL LOW (ref 8.9–10.3)
Chloride: 108 mmol/L (ref 98–111)
Creatinine, Ser: 0.92 mg/dL (ref 0.44–1.00)
GFR calc Af Amer: >60 mL/min (ref >60)
GFR calc non Af Amer: >60 mL/min (ref >60)
Glucose, Bld: 103 mg/dL — ABNORMAL HIGH (ref 70–99)
Potassium: 3.8 mmol/L (ref 3.5–5.1)
Sodium: 139 mmol/L (ref 135–145)

## 2020-03-10 LAB — TROPONIN I (HIGH SENSITIVITY): Troponin I (High Sensitivity): <2 ng/L (ref <18)

## 2020-03-10 MED ORDER — NORGESTIMATE-ETH ESTRADIOL 0.25-35 MG-MCG PO TABS: 1.0000 | ORAL_TABLET | Freq: Every day | ORAL | 1 refills | Status: DC

## 2020-03-10 MED ORDER — SODIUM CHLORIDE 0.9% FLUSH: 3.0000 mL | Freq: Once | INTRAVENOUS | Status: DC

## 2020-03-10 NOTE — ED Triage Notes (Signed)
Pt to ER reports intermittent sharp chest pains that started earlier this week.  Pt states pains last seconds at a time and accompanied by shortness of breath.  PT denies other s/s.

## 2020-03-10 NOTE — ED Notes (Signed)
Pt verbalized understanding of discharge instructions. NAD at this time. 

## 2020-03-10 NOTE — ED Provider Notes (Signed)
[facility] Emergency Department Provider Note  Time seen: 11:22 AM  I have reviewed the triage vital signs and the nursing notes.   HISTORY  Chief Complaint Chest Pain   HPI Felicia Avila is a 24 y.o. female with no significant past medical history presents to the emergency department for chest pain.  According to the patient over the past  week or so she has been experiencing intermittent chest pain which she states is sharp comes and goes.  Denies any pleuritic nature of the pain.  No leg pain or swelling.  Patient states she has been experiencing mild cough and congestion over the past week as well.  No fever.  No known sick contacts.  Patient also states that secondary complaint over the past 2 to 3 months she has been experiencing intermittent vaginal spotting.  Denies discharge.  Denies abdominal pain.  Past Medical History:  Diagnosis Date  . Hx of ovarian cyst     There are no problems to display for this patient.   History reviewed. No pertinent surgical history.  Prior to Admission medications   Medication Sig Start Date End Date Taking? Authorizing Provider  amoxicillin (AMOXIL) 500 MG capsule Take 1 capsule (500 mg total) by mouth 3 (three) times daily. 01/04/20   Versie Starks, PA-C    No Known Allergies  History reviewed. No pertinent family history.  Social History Social History   Tobacco Use  . Smoking status: Former Research scientist (life sciences)  . Smokeless tobacco: Never Used  Substance Use Topics  . Alcohol use: Yes    Comment: occasionally  . Drug use: Never    Review of Systems Constitutional: Negative for fever. Cardiovascular: Positive for intermittent chest pain.  None currently. Respiratory: Occasional mild shortness of breath.  Mild cough and congestion x1 week. Gastrointestinal: Negative for abdominal pain, vomiting  Genitourinary: Intermittent vaginal spotting over the past 2 to 3 months.  No discharge. Musculoskeletal: Negative  for musculoskeletal complaints Neurological: Negative for headache All other ROS negative  ____________________________________________   PHYSICAL EXAM:  VITAL SIGNS: ED Triage Vitals  Enc Vitals Group     BP 03/10/20 0949 120/77     Pulse Rate 03/10/20 0949 73     Resp 03/10/20 0949 15     Temp 03/10/20 0949 98.1 F (36.7 C)     Temp Source 03/10/20 0949 Oral     SpO2 03/10/20 0949 99 %     Weight 03/10/20 0945 146 lb (66.2 kg)     Height 03/10/20 0945 5\' 3"  (1.6 m)     Head Circumference --      Peak Flow --      Pain Score 03/10/20 0945 0     Pain Loc --      Pain Edu? --      Excl. in [employer]? --    Constitutional: Alert and oriented. Well appearing and in no distress. Eyes: Normal exam ENT      Head: Normocephalic and atraumatic.      Mouth/Throat: Mucous membranes are moist. Cardiovascular: Normal rate, regular rhythm.  Respiratory: Normal respiratory effort without tachypnea nor retractions. Breath sounds are clear.  Chest wall is nontender. Gastrointestinal: Soft and nontender. No distention.  Musculoskeletal: Nontender with normal range of motion in all extremities.  Neurologic:  Normal speech and language. No gross focal neurologic deficits  Skin:  Skin is warm, dry and intact.  Psychiatric: Mood and affect are normal. Speech and behavior are normal.   ____________________________________________  EKG  EKG viewed and interpreted by myself shows a normal sinus rhythm 84 bpm with a narrow QRS, normal axis, normal intervals, no concerning ST changes.  ____________________________________________    RADIOLOGY  Chest x-ray is clear  ____________________________________________   INITIAL IMPRESSION / ASSESSMENT AND PLAN / ED COURSE  Pertinent labs & imaging results that were available during my care of the patient were reviewed by me and considered in my medical decision making (see chart for details).   Patient presents to the emergency department with  chest pain as well as intermittent vaginal bleeding.  As far as the chest pain patient appears well, reassuring vitals, reassuring physical exam.  Patient's labs including cardiac enzymes are normal.  Chest x-ray is clear.  EKG is normal.  Overall patient appears well.  Patient has a benign abdominal exam does state intermittent spotting over the past 2 to 3 months.  Pregnancy test is negative.  I discussed pros and cons of hormonal birth control treatment of her dysfunctional uterine bleeding.  Patient agreeable to plan of care and will also follow-up with OB/GYN.  Discussed my typical chest pain return precautions however given her reassuring work-up with physical exam I believe the patient is safe for discharge home.  Felicia Avila was evaluated in Emergency Department on 03/10/2020 for the symptoms described in the history of present illness. She was evaluated in the context of the global COVID-19 pandemic, which necessitated consideration that the patient might be at risk for infection with the SARS-CoV-2 virus that causes COVID-19. Institutional protocols and algorithms that pertain to the evaluation of patients at risk for COVID-19 are in a state of rapid change based on information released by regulatory bodies including the CDC and federal and state organizations. These policies and algorithms were followed during the patient's care in the ED.  ____________________________________________   FINAL CLINICAL IMPRESSION(S) / ED DIAGNOSES  Chest pain Dysfunctional uterine bleeding   Harvest Dark, MD 03/10/20 1125

## 2020-08-08 ENCOUNTER — Emergency Department
Admission: EM | Admit: 2020-08-08 | Discharge: 2020-08-08 | Disposition: A | Discharge status: Home / Self Care | Payer: Self-pay | Attending: Emergency Medicine | Admitting: Emergency Medicine

## 2020-08-08 ENCOUNTER — Telehealth: Payer: Self-pay | Admitting: Emergency Medicine

## 2020-08-08 ENCOUNTER — Encounter: Payer: Self-pay | Admitting: Intensive Care

## 2020-08-08 ENCOUNTER — Other Ambulatory Visit: Payer: Self-pay

## 2020-08-08 DIAGNOSIS — A749 Chlamydial infection, unspecified: Secondary | ICD-10-CM

## 2020-08-08 DIAGNOSIS — X58XXXA Exposure to other specified factors, initial encounter: Secondary | ICD-10-CM | POA: Insufficient documentation

## 2020-08-08 DIAGNOSIS — B373 Candidiasis of vulva and vagina: Secondary | ICD-10-CM | POA: Insufficient documentation

## 2020-08-08 DIAGNOSIS — S39012A Strain of muscle, fascia and tendon of lower back, initial encounter: Secondary | ICD-10-CM | POA: Insufficient documentation

## 2020-08-08 DIAGNOSIS — Z87891 Personal history of nicotine dependence: Secondary | ICD-10-CM | POA: Insufficient documentation

## 2020-08-08 DIAGNOSIS — B3731 Acute candidiasis of vulva and vagina: Secondary | ICD-10-CM

## 2020-08-08 HISTORY — DX: Scoliosis, unspecified: M41.9

## 2020-08-08 LAB — URINALYSIS, COMPLETE (UACMP) WITH MICROSCOPIC
Bacteria, UA: NONE SEEN
Bilirubin Urine: NEGATIVE
Glucose, UA: NEGATIVE mg/dL
Hgb urine dipstick: NEGATIVE
Ketones, ur: NEGATIVE mg/dL
Nitrite: NEGATIVE
Protein, ur: NEGATIVE mg/dL
Specific Gravity, Urine: 1.013 (ref 1.005–1.030)
pH: 8 (ref 5.0–8.0)

## 2020-08-08 LAB — WET PREP, GENITAL
Clue Cells Wet Prep HPF POC: NONE SEEN
Sperm: NONE SEEN
Trich, Wet Prep: NONE SEEN

## 2020-08-08 LAB — CHLAMYDIA/NGC RT PCR (ARMC ONLY)
Chlamydia Tr: DETECTED — AB
N gonorrhoeae: NOT DETECTED

## 2020-08-08 LAB — POC URINE PREG, ED: Preg Test, Ur: NEGATIVE

## 2020-08-08 MED ORDER — METAXALONE 800 MG PO TABS: 800.0000 mg | ORAL_TABLET | Freq: Three times a day (TID) | ORAL | 0 refills | Status: AC

## 2020-08-08 MED ORDER — TERCONAZOLE 0.8 % VA CREA: 1.0000 | TOPICAL_CREAM | Freq: Every day | VAGINAL | 0 refills | Status: DC

## 2020-08-08 MED ORDER — NABUMETONE 750 MG PO TABS: 750.0000 mg | ORAL_TABLET | Freq: Two times a day (BID) | ORAL | 0 refills | Status: AC

## 2020-08-08 MED ORDER — AZITHROMYCIN 500 MG PO TABS: 1000.0000 mg | ORAL_TABLET | Freq: Once | ORAL | 0 refills | Status: AC

## 2020-08-08 NOTE — ED Provider Notes (Signed)
[facility] Emergency Department Provider Note ____________________________________________  Time seen: 1209  I have reviewed the triage vital signs and the nursing notes.  HISTORY  Chief Complaint  Back Pain and Vaginal Itching  HPI Felicia Avila is a 24 y.o. female `presents as up to the ED for evaluation of low back pain with onset today as well as some vaginal itching.  She denies any frank vaginal discharge or abnormal vaginal bleeding.  She also denies any injury preceding the onset of her bilateral thoracolumbar back pain.  She denies any frank, flank pain and also denies any dysuria or hematuria.   Denies any bladder or bowel incontinence, foot drop, or saddle anesthesia patient denies any trauma preceding onset of her back pain which is been present for the last several days.  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  Past Medical History:  Diagnosis Date  . Hx of ovarian cyst   . Scoliosis     There are no problems to display for this patient.   History reviewed. No pertinent surgical history.  Prior to Admission medications   Medication Sig Start Date End Date Taking? Authorizing Provider  azithromycin (ZITHROMAX) 500 MG tablet Take 2 tablets (1,000 mg total) by mouth once for 1 dose. 08/08/20 08/08/20  Luay Balding, Dannielle Karvonen, PA-C  metaxalone (SKELAXIN) 800 MG tablet Take 1 tablet (800 mg total) by mouth 3 (three) times daily for 10 days. 08/08/20 08/18/20  Evadean Sproule, Dannielle Karvonen, PA-C  nabumetone (RELAFEN) 750 MG tablet Take 1 tablet (750 mg total) by mouth 2 (two) times daily for 15 days. 08/08/20 08/23/20  Daejah Klebba, Dannielle Karvonen, PA-C  norgestimate-ethinyl estradiol (SPRINTEC 28) 0.25-35 MG-MCG tablet Take 1 tablet by mouth daily. 03/10/20   Harvest Dark, MD  terconazole (TERAZOL 3) 0.8 % vaginal cream Place 1 applicator vaginally at bedtime. 08/08/20   Jalysa Swopes, Dannielle Karvonen, PA-C     Allergies Patient has no known allergies.  History reviewed. No pertinent family history.  Social History Social History   Tobacco Use  . Smoking status: Former Research scientist (life sciences)  . Smokeless tobacco: Never Used  Substance Use Topics  . Alcohol use: Yes    Alcohol/week: 4.0 standard drinks    Types: 4 Glasses of wine per week  . Drug use: Never    Review of Systems  Constitutional: Negative for fever. Eyes: Negative for visual changes. ENT: Negative for sore throat. Cardiovascular: Negative for chest pain. Respiratory: Negative for shortness of breath. Gastrointestinal: Negative for abdominal pain, vomiting and diarrhea. Genitourinary: Negative for dysuria.  Reports vaginal itching. Musculoskeletal: Positive for back pain. Skin: Negative for rash. Neurological: Negative for headaches, focal weakness or numbness. ____________________________________________  PHYSICAL EXAM:  VITAL SIGNS: ED Triage Vitals  Enc Vitals Group     BP 08/08/20 1146 123/77     Pulse Rate 08/08/20 1146 90     Resp 08/08/20 1146 18     Temp 08/08/20 1146 99.1 F (37.3 C)     Temp Source 08/08/20 1146 Oral     SpO2 08/08/20 1146 100 %     Weight 08/08/20 1147 154 lb (69.9 kg)     Height 08/08/20 1147 5\' 4"  (1.626 m)     Head Circumference --      Peak Flow --      Pain Score 08/08/20 1152 3     Pain Loc --      Pain Edu? --  Excl. in [place]? --     Constitutional: Alert and oriented. Well appearing and in no distress. Head: Normocephalic and atraumatic. Eyes: Conjunctivae are normal. PERRL. Normal extraocular movements Cardiovascular: Normal rate, regular rhythm. Normal distal pulses. Respiratory: Normal respiratory effort. No wheezes/rales/rhonchi. Gastrointestinal: Soft and nontender. No distention.  Normoactive bowel sounds appreciated.  No CVA tenderness elicited. GU: Normal external genitalia.  Patient with thick clumpy white discharge noted in the cul-de-sac.  No adnexal masses  appreciated.  No cervical motion tenderness noted. Musculoskeletal: Normal spinal alignment without midline tenderness, spasm, vomiting, or step-off.  Patient mildly tender to palpation to bilateral paraspinal musculature of the thoracolumbar region.  Nontender with normal range of motion in all extremities.  Neurologic:  Normal gait without ataxia. Normal speech and language. No gross focal neurologic deficits are appreciated. Skin:  Skin is warm, dry and intact. No rash noted. Psychiatric: Mood and affect are normal. Patient exhibits appropriate insight and judgment. ____________________________________________   LABS (pertinent positives/negatives)  Labs Reviewed  WET PREP, GENITAL - Abnormal; Notable for the following components:      Result Value   Yeast Wet Prep HPF POC PRESENT (*)    WBC, Wet Prep HPF POC FEW (*)    All other components within normal limits  CHLAMYDIA/NGC RT PCR ([facility] ONLY) - Abnormal; Notable for the following components:   Chlamydia Tr DETECTED (*)    All other components within normal limits  URINALYSIS, COMPLETE (UACMP) WITH MICROSCOPIC - Abnormal; Notable for the following components:   Color, Urine STRAW (*)    APPearance CLEAR (*)    Leukocytes,Ua TRACE (*)    All other components within normal limits  POC URINE PREG, ED  ___________________________________________  PROCEDURES  Procedures ____________________________________________  INITIAL IMPRESSION / ASSESSMENT AND PLAN / ED COURSE  ----------------------------------------- 3:52 PM on 08/08/2020 ----------------------------------------- Delayed reporting of the patient's GC culture comes back with positive chlamydia screen.  Patient will be notified via phone by the Bethena Roys, RN.  Prescription for azithromycin 1 g is been submitted to her pharmacy of choice.  Patient with ED evaluation of what appears to be musculoskeletal back pain as well as a positive chlamydia and candidiasis.   Prescriptions are sent to the patient's pharmacy including Terazol 3, Relafen, Skelaxin, and a azithromycin.  Felicia Avila was evaluated in Emergency Department on 08/08/2020 for the symptoms described in the history of present illness. She was evaluated in the context of the global COVID-19 pandemic, which necessitated consideration that the patient might be at risk for infection with the SARS-CoV-2 virus that causes COVID-19. Institutional protocols and algorithms that pertain to the evaluation of patients at risk for COVID-19 are in a state of rapid change based on information released by regulatory bodies including the CDC and federal and state organizations. These policies and algorithms were followed during the patient's care in the ED. ____________________________________________  FINAL CLINICAL IMPRESSION(S) / ED DIAGNOSES  Final diagnoses:  Strain of lumbar region, initial encounter  Yeast vaginitis  Chlamydia      Crescent Gotham, Dannielle Karvonen, PA-C 08/08/20 1654    Duffy Bruce, MD 08/09/20 1214

## 2020-08-08 NOTE — ED Triage Notes (Signed)
Patient c/o lower back pain that started today and vaginal itching. Denies discharge.

## 2020-08-08 NOTE — Telephone Encounter (Signed)
Called pateint to inform of positive chlamydia test and need for treatment.  Explained that medication would be sent to [facility] to treat this.  Also explained need for partner treatment.  Can be treated at urgent care or free treatment at [facility].

## 2020-08-08 NOTE — Discharge Instructions (Signed)
Take the prescription meds as provided.

## 2020-11-07 ENCOUNTER — Other Ambulatory Visit: Payer: Self-pay

## 2020-11-07 ENCOUNTER — Emergency Department: Payer: Self-pay

## 2020-11-07 ENCOUNTER — Emergency Department
Admission: EM | Admit: 2020-11-07 | Discharge: 2020-11-07 | Disposition: A | Discharge status: Home / Self Care | Payer: Self-pay | Attending: Emergency Medicine | Admitting: Emergency Medicine

## 2020-11-07 ENCOUNTER — Encounter: Payer: Self-pay | Admitting: Emergency Medicine

## 2020-11-07 DIAGNOSIS — R102 Pelvic and perineal pain unspecified side: Secondary | ICD-10-CM

## 2020-11-07 DIAGNOSIS — B9689 Other specified bacterial agents as the cause of diseases classified elsewhere: Secondary | ICD-10-CM

## 2020-11-07 DIAGNOSIS — N760 Acute vaginitis: Secondary | ICD-10-CM | POA: Insufficient documentation

## 2020-11-07 DIAGNOSIS — R9389 Abnormal findings on diagnostic imaging of other specified body structures: Secondary | ICD-10-CM

## 2020-11-07 DIAGNOSIS — N7011 Chronic salpingitis: Secondary | ICD-10-CM

## 2020-11-07 DIAGNOSIS — R1010 Upper abdominal pain, unspecified: Secondary | ICD-10-CM

## 2020-11-07 DIAGNOSIS — Z87891 Personal history of nicotine dependence: Secondary | ICD-10-CM | POA: Insufficient documentation

## 2020-11-07 LAB — COMPREHENSIVE METABOLIC PANEL
ALT: 15 U/L (ref 0–44)
AST: 16 U/L (ref 15–41)
Albumin: 3.5 g/dL (ref 3.5–5.0)
Alkaline Phosphatase: 62 U/L (ref 38–126)
Anion gap: 8 (ref 5–15)
BUN: 10 mg/dL (ref 6–20)
CO2: 23 mmol/L (ref 22–32)
Calcium: 8.5 mg/dL — ABNORMAL LOW (ref 8.9–10.3)
Chloride: 105 mmol/L (ref 98–111)
Creatinine, Ser: 0.63 mg/dL (ref 0.44–1.00)
GFR, Estimated: >60 mL/min (ref >60)
Glucose, Bld: 93 mg/dL (ref 70–99)
Potassium: 3.8 mmol/L (ref 3.5–5.1)
Sodium: 136 mmol/L (ref 135–145)
Total Bilirubin: 0.4 mg/dL (ref 0.3–1.2)
Total Protein: 7.5 g/dL (ref 6.5–8.1)

## 2020-11-07 LAB — LIPASE, BLOOD: Lipase: 31 U/L (ref 11–51)

## 2020-11-07 LAB — URINALYSIS, COMPLETE (UACMP) WITH MICROSCOPIC
Bilirubin Urine: NEGATIVE
Glucose, UA: NEGATIVE mg/dL
Hgb urine dipstick: NEGATIVE
Ketones, ur: NEGATIVE mg/dL
Nitrite: NEGATIVE
Protein, ur: NEGATIVE mg/dL
Specific Gravity, Urine: 1.014 (ref 1.005–1.030)
pH: 5 (ref 5.0–8.0)

## 2020-11-07 LAB — CBC
HCT: 37.1 % (ref 36.0–46.0)
Hemoglobin: 11.7 g/dL — ABNORMAL LOW (ref 12.0–15.0)
MCH: 27.1 pg (ref 26.0–34.0)
MCHC: 31.5 g/dL (ref 30.0–36.0)
MCV: 86.1 fL (ref 80.0–100.0)
Platelets: 337 10*3/uL (ref 150–400)
RBC: 4.31 MIL/uL (ref 3.87–5.11)
RDW: 15.9 % — ABNORMAL HIGH (ref 11.5–15.5)
WBC: 7.3 10*3/uL (ref 4.0–10.5)
nRBC: 0 % (ref 0.0–0.2)

## 2020-11-07 LAB — WET PREP, GENITAL
Sperm: NONE SEEN
Trich, Wet Prep: NONE SEEN
Yeast Wet Prep HPF POC: NONE SEEN

## 2020-11-07 LAB — POC URINE PREG, ED: Preg Test, Ur: NEGATIVE

## 2020-11-07 LAB — CHLAMYDIA/NGC RT PCR (ARMC ONLY)
Chlamydia Tr: NOT DETECTED
N gonorrhoeae: NOT DETECTED

## 2020-11-07 MED ORDER — PANTOPRAZOLE SODIUM 40 MG PO TBEC: 40.0000 mg | DELAYED_RELEASE_TABLET | Freq: Every day | ORAL | 1 refills | Status: DC

## 2020-11-07 MED ORDER — METRONIDAZOLE 500 MG PO TABS: 500.0000 mg | ORAL_TABLET | Freq: Two times a day (BID) | ORAL | 0 refills | Status: AC

## 2020-11-07 MED ORDER — DICYCLOMINE HCL 10 MG PO CAPS
10.0000 mg | ORAL_CAPSULE | Freq: Once | ORAL | Status: AC
  Administered 2020-11-07: 10 mg via ORAL
  Filled 2020-11-07: qty 1

## 2020-11-07 MED ORDER — IOHEXOL 300 MG/ML  SOLN
100.0000 mL | Freq: Once | INTRAMUSCULAR | Status: AC | PRN
  Administered 2020-11-07: 100 mL via INTRAVENOUS
  Filled 2020-11-07: qty 100

## 2020-11-07 NOTE — ED Triage Notes (Signed)
Pt comes into the ED via POV c/o upper abdominal pain since Sunday with one episode of emesis.  Pt has even and unlabored respirations and is in NAD as she is ambulatory to triage.  Pt denies any constipation or diarrhea.

## 2020-11-07 NOTE — Discharge Instructions (Addendum)
Follow-up with Dr. Kenton Kingfisher due to the hydrosalpinx in your pelvis. Follow-up with Dr. Alice Reichert for the upper abdominal pain. Take the Flagyl for the bacterial vaginosis Take Protonix for the abdominal pain. Return emergency department worsening

## 2020-11-07 NOTE — ED Notes (Addendum)
Patient transported to [facility] 

## 2020-11-07 NOTE — ED Provider Notes (Signed)
[facility] Emergency Department Provider Note  ____________________________________________   Event Date/Time   First MD Initiated Contact with Patient 11/07/20 1235     (approximate)  I have reviewed the triage vital signs and the nursing notes.   HISTORY  Chief Complaint Abdominal Pain    HPI Felicia Avila is a 25 y.o. female presents emergency department complaining of upper abdominal pain since Sunday.  One episode of vomiting.  Patient states that she tries to eat but feels very full.  States her abdomen looks very bloated.  No diarrhea.  No constipation.    Past Medical History:  Diagnosis Date  . Hx of ovarian cyst   . Scoliosis     There are no problems to display for this patient.   History reviewed. No pertinent surgical history.  Prior to Admission medications   Medication Sig Start Date End Date Taking? Authorizing Provider  metroNIDAZOLE (FLAGYL) 500 MG tablet Take 1 tablet (500 mg total) by mouth 2 (two) times daily for 7 days. 11/07/20 11/14/20 Yes Sladen Plancarte, Linden Dolin, PA-C  pantoprazole (PROTONIX) 40 MG tablet Take 1 tablet (40 mg total) by mouth daily. 11/07/20 11/07/21 Yes Jesi Jurgens, Linden Dolin, PA-C  norgestimate-ethinyl estradiol (SPRINTEC 28) 0.25-35 MG-MCG tablet Take 1 tablet by mouth daily. 03/10/20   Harvest Dark, MD  terconazole (TERAZOL 3) 0.8 % vaginal cream Place 1 applicator vaginally at bedtime. 08/08/20   Menshew, Dannielle Karvonen, PA-C    Allergies Patient has no known allergies.  History reviewed. No pertinent family history.  Social History Social History   Tobacco Use  . Smoking status: Former Research scientist (life sciences)  . Smokeless tobacco: Never Used  Substance Use Topics  . Alcohol use: Yes    Alcohol/week: 4.0 standard drinks    Types: 4 Glasses of wine per week  . Drug use: Never    Review of Systems  Constitutional: No fever/chills Eyes: No visual changes. ENT: No sore throat. Respiratory: Denies  cough Cardiovascular: Denies chest pain Gastrointestinal: Positive abdominal pain Genitourinary: Negative for dysuria. Musculoskeletal: Negative for back pain. Skin: Negative for rash. Psychiatric: no mood changes,     ____________________________________________   PHYSICAL EXAM:  VITAL SIGNS: ED Triage Vitals  Enc Vitals Group     BP 11/07/20 1131 (!) 123/96     Pulse Rate 11/07/20 1131 79     Resp 11/07/20 1131 18     Temp 11/07/20 1131 98.8 F (37.1 C)     Temp Source 11/07/20 1131 Oral     SpO2 11/07/20 1131 100 %     Weight 11/07/20 1122 169 lb (76.7 kg)     Height 11/07/20 1122 5\' 4"  (1.626 m)     Head Circumference --      Peak Flow --      Pain Score 11/07/20 1122 8     Pain Loc --      Pain Edu? --      Excl. in [place]? --     Constitutional: Alert and oriented. Well appearing and in no acute distress. Eyes: Conjunctivae are normal.  Head: Atraumatic. Nose: No congestion/rhinnorhea. Mouth/Throat: Mucous membranes are moist.   Neck:  supple no lymphadenopathy noted Cardiovascular: Normal rate, regular rhythm. Heart sounds are normal Respiratory: Normal respiratory effort.  No retractions, lungs c t a  Abd: soft tender in the epigastric area, abdomen appears to be bloated bs normal all 4 quad GU: deferred Musculoskeletal: FROM all extremities, warm and well perfused Neurologic:  Normal speech  and language.  Skin:  Skin is warm, dry and intact. No rash noted. Psychiatric: Mood and affect are normal. Speech and behavior are normal.  ____________________________________________   LABS (all labs ordered are listed, but only abnormal results are displayed)  Labs Reviewed  WET PREP, GENITAL - Abnormal; Notable for the following components:      Result Value   Clue Cells Wet Prep HPF POC PRESENT (*)    WBC, Wet Prep HPF POC FEW (*)    All other components within normal limits  COMPREHENSIVE METABOLIC PANEL - Abnormal; Notable for the following components:    Calcium 8.5 (*)    All other components within normal limits  CBC - Abnormal; Notable for the following components:   Hemoglobin 11.7 (*)    RDW 15.9 (*)    All other components within normal limits  URINALYSIS, COMPLETE (UACMP) WITH MICROSCOPIC - Abnormal; Notable for the following components:   Color, Urine YELLOW (*)    APPearance HAZY (*)    Leukocytes,Ua SMALL (*)    Bacteria, UA RARE (*)    All other components within normal limits  CHLAMYDIA/NGC RT PCR ([facility] ONLY)  LIPASE, BLOOD  POC URINE PREG, ED   ____________________________________________   ____________________________________________  RADIOLOGY  X-ray of the abdomen CT abdomen/pelvis  ____________________________________________   PROCEDURES  Procedure(s) performed: No  Procedures    ____________________________________________   INITIAL IMPRESSION / ASSESSMENT AND PLAN / ED COURSE  Pertinent labs & imaging results that were available during my care of the patient were reviewed by me and considered in my medical decision making (see chart for details).   Patient is a 25 year old female presents with abdominal pain.  See HPI.  Physical exam shows patient to appear stable.  DDx: Small bowel obstruction, pregnancy, constipation, pancreatitis, acute cholecystitis  CBC is normal, complete metabolic panel is normal, POC pregnancy is negative, urinalysis is normal a small amount of leuks, lipase is normal  X-ray of the abdomen reviewed by me confirmed by radiology to appear normal  CT abdomen/pelvis  Patient was given Bentyl p.o.   CT abdomen/pelvis shows concerns for hydrosalpinx.  Radiologist is recommending an pelvic ultrasound.  Pelvic done.  GC/Chlamydia are negative.  Wet prep shows clue cells for bacterial vaginosis.  Ultrasound of the pelvis shows a small hydrosalpinx  Consult to Dr Kenton Kingfisher from ob/gyn, states she can f/u in office, if needs surgery can be done on outpatient  basis  All information was given to the patient.  She is to take Flagyl for the bacterial vaginosis.  Follow-up Dr. Kenton Kingfisher in his office for the hydrosalpinx.  Follow-up with Dr. Alice Reichert for the upper abdominal pain.  Patient states she understands.  She is given prescription for Protonix.  She is discharged stable condition.  Felicia Avila was evaluated in Emergency Department on 11/07/2020 for the symptoms described in the history of present illness. She was evaluated in the context of the global COVID-19 pandemic, which necessitated consideration that the patient might be at risk for infection with the SARS-CoV-2 virus that causes COVID-19. Institutional protocols and algorithms that pertain to the evaluation of patients at risk for COVID-19 are in a state of rapid change based on information released by regulatory bodies including the CDC and federal and state organizations. These policies and algorithms were followed during the patient's care in the ED.    As part of my medical decision making, I reviewed the following data within the [place] notes  reviewed and incorporated, Labs reviewed , Old chart reviewed, Radiograph reviewed , A consult was requested and obtained from this/these consultant(s) gynecology, Notes from prior ED visits and [place] Controlled Substance Database  ____________________________________________   FINAL CLINICAL IMPRESSION(S) / ED DIAGNOSES  Final diagnoses:  Pain of upper abdomen  Hydrosalpinx  Bacterial vaginosis      NEW MEDICATIONS STARTED DURING THIS VISIT:  New Prescriptions   METRONIDAZOLE (FLAGYL) 500 MG TABLET    Take 1 tablet (500 mg total) by mouth 2 (two) times daily for 7 days.   PANTOPRAZOLE (PROTONIX) 40 MG TABLET    Take 1 tablet (40 mg total) by mouth daily.     Note:  This document was prepared using Dragon voice recognition software and may include unintentional dictation errors.    Versie Starks,  PA-C 11/07/20 Ellison Hughs, MD 11/15/20 [phone]

## 2021-04-08 ENCOUNTER — Other Ambulatory Visit: Payer: Self-pay

## 2021-04-08 ENCOUNTER — Emergency Department (HOSPITAL_COMMUNITY)
Admission: EM | Admit: 2021-04-08 | Discharge: 2021-04-09 | Disposition: A | Discharge status: Home / Self Care | Payer: BC Managed Care – PPO | Attending: Emergency Medicine | Admitting: Emergency Medicine

## 2021-04-08 ENCOUNTER — Encounter (HOSPITAL_COMMUNITY): Payer: Self-pay | Admitting: Emergency Medicine

## 2021-04-08 DIAGNOSIS — B373 Candidiasis of vulva and vagina: Secondary | ICD-10-CM | POA: Diagnosis not present

## 2021-04-08 DIAGNOSIS — B3731 Acute candidiasis of vulva and vagina: Secondary | ICD-10-CM

## 2021-04-08 DIAGNOSIS — Z87891 Personal history of nicotine dependence: Secondary | ICD-10-CM | POA: Diagnosis not present

## 2021-04-08 NOTE — ED Triage Notes (Signed)
Pt c/o recurrent yeast infection.

## 2021-04-09 LAB — URINALYSIS, ROUTINE W REFLEX MICROSCOPIC
Bilirubin Urine: NEGATIVE
Glucose, UA: NEGATIVE mg/dL
Ketones, ur: NEGATIVE mg/dL
Nitrite: NEGATIVE
Protein, ur: NEGATIVE mg/dL
Specific Gravity, Urine: 1.013 (ref 1.005–1.030)
pH: 7 (ref 5.0–8.0)

## 2021-04-09 LAB — WET PREP, GENITAL
Clue Cells Wet Prep HPF POC: NONE SEEN
Trich, Wet Prep: NONE SEEN

## 2021-04-09 LAB — POC URINE PREG, ED: Preg Test, Ur: NEGATIVE

## 2021-04-09 MED ORDER — FLUCONAZOLE 150 MG PO TABS
150.0000 mg | ORAL_TABLET | Freq: Once | ORAL | Status: AC
  Administered 2021-04-09: 150 mg via ORAL
  Filled 2021-04-09: qty 1

## 2021-04-09 MED ORDER — FLUCONAZOLE 150 MG PO TABS: 150.0000 mg | ORAL_TABLET | Freq: Every day | ORAL | 0 refills | Status: DC

## 2021-04-09 NOTE — ED Provider Notes (Signed)
[facility] EMERGENCY DEPARTMENT Provider Note   CSN: [id number] Arrival date & time: 04/08/21  2325     History Chief Complaint  Patient presents with   Vaginal Itching    Felicia Avila is a 25 y.o. female.  Patient thinks that she has a recurrent vaginal yeast infection.  She was recently treated with Diflucan.  Since her menstrual cycle finished she has now been experiencing vaginal irritation similar to prior.  No new vaginal discharge.  No unusual bleeding.  No pelvic pain.  No external lesions.      Past Medical History:  Diagnosis Date   Hx of ovarian cyst    Scoliosis     There are no problems to display for this patient.   History reviewed. No pertinent surgical history.   OB History     Gravida  0   Para  0   Term  0   Preterm  0   AB  0   Living  0      SAB  0   IAB  0   Ectopic  0   Multiple  0   Live Births  0           No family history on file.  Social History   Tobacco Use   Smoking status: Former   Smokeless tobacco: Never  Scientific laboratory technician Use: Every day  Substance Use Topics   Alcohol use: Yes    Alcohol/week: 4.0 standard drinks    Types: 4 Glasses of wine per week   Drug use: Never    Home Medications Prior to Admission medications   Medication Sig Start Date End Date Taking? Authorizing Provider  fluconazole (DIFLUCAN) 150 MG tablet Take 1 tablet (150 mg total) by mouth daily. 04/09/21  Yes Jamareon Shimel, Gwenyth Allegra, MD  norgestimate-ethinyl estradiol ([place] 28) 0.25-35 MG-MCG tablet Take 1 tablet by mouth daily. 03/10/20   Harvest Dark, MD  pantoprazole (PROTONIX) 40 MG tablet Take 1 tablet (40 mg total) by mouth daily. 11/07/20 11/07/21  Fisher, Linden Dolin, PA-C  terconazole (TERAZOL 3) 0.8 % vaginal cream Place 1 applicator vaginally at bedtime. 08/08/20   Menshew, Dannielle Karvonen, PA-C    Allergies    Patient has no known allergies.  Review of Systems   Review of Systems  Genitourinary:  Negative  for vaginal discharge.       Vaginal irritation   Physical Exam Updated Vital Signs BP (!) 144/81   Pulse 77   Temp 99.6 F (37.6 C) (Oral)   Resp 18   Ht '5\' 4"'$  (1.626 m)   Wt 76 kg   LMP 03/31/2021   SpO2 100%   BMI 28.76 kg/m   Physical Exam Vitals and nursing note reviewed.  Constitutional:      General: She is not in acute distress.    Appearance: Normal appearance. She is well-developed.  HENT:     Head: Normocephalic and atraumatic.     Right Ear: Hearing normal.     Left Ear: Hearing normal.     Nose: Nose normal.  Eyes:     Conjunctiva/sclera: Conjunctivae normal.     Pupils: Pupils are equal, round, and reactive to light.  Cardiovascular:     Rate and Rhythm: Regular rhythm.     Heart sounds: S1 normal and S2 normal. No murmur heard.   No friction rub. No gallop.  Pulmonary:     Effort: Pulmonary effort is normal. No respiratory distress.  Breath sounds: Normal breath sounds.  Chest:     Chest wall: No tenderness.  Abdominal:     General: Bowel sounds are normal.     Palpations: Abdomen is soft.     Tenderness: There is no abdominal tenderness. There is no guarding or rebound. Negative signs include Murphy's sign and McBurney's sign.     Hernia: No hernia is present.  Genitourinary:    General: Normal vulva.     Vagina: Normal.     Cervix: Normal.     Uterus: Normal.      Adnexa: Right adnexa normal and left adnexa normal.  Musculoskeletal:        General: Normal range of motion.     Cervical back: Normal range of motion and neck supple.  Skin:    General: Skin is warm and dry.     Findings: No rash.  Neurological:     Mental Status: She is alert and oriented to person, place, and time.     GCS: GCS eye subscore is 4. GCS verbal subscore is 5. GCS motor subscore is 6.     Cranial Nerves: No cranial nerve deficit.     Sensory: No sensory deficit.     Coordination: Coordination normal.  Psychiatric:        Speech: Speech normal.         Behavior: Behavior normal.        Thought Content: Thought content normal.    ED Results / Procedures / Treatments   Labs (all labs ordered are listed, but only abnormal results are displayed) Labs Reviewed  WET PREP, GENITAL - Abnormal; Notable for the following components:      Result Value   Yeast Wet Prep HPF POC PRESENT (*)    WBC, Wet Prep HPF POC MODERATE (*)    All other components within normal limits  URINALYSIS, ROUTINE W REFLEX MICROSCOPIC - Abnormal; Notable for the following components:   APPearance HAZY (*)    Hgb urine dipstick SMALL (*)    Leukocytes,Ua MODERATE (*)    Bacteria, UA RARE (*)    All other components within normal limits  POC URINE PREG, ED  GC/CHLAMYDIA PROBE AMP ([place]) NOT AT [facility]    EKG None  Radiology No results found.  Procedures Procedures   Medications Ordered in ED Medications  fluconazole (DIFLUCAN) tablet 150 mg (has no administration in time range)    ED Course  I have reviewed the triage vital signs and the nursing notes.  Pertinent labs & imaging results that were available during my care of the patient were reviewed by me and considered in my medical decision making (see chart for details).    MDM Rules/Calculators/A&P                           Patient presents with recurrent vaginal irritation.  Patient was recently treated with a single dose of Diflucan, symptoms resolved but now have returned.  Physical examination is unremarkable.  Wet prep does show yeast.  We will treat with serial Diflucan tablets. Final Clinical Impression(s) / ED Diagnoses Final diagnoses:  Yeast vaginitis    Rx / DC Orders ED Discharge Orders          Ordered    fluconazole (DIFLUCAN) 150 MG tablet  Daily        04/09/21 0112             Monserath Neff, Gwenyth Allegra, MD  04/09/21 0114  

## 2021-04-10 LAB — GC/CHLAMYDIA PROBE AMP (~~LOC~~) NOT AT ARMC
Chlamydia: NEGATIVE
Comment: NEGATIVE
Comment: NORMAL
Neisseria Gonorrhea: NEGATIVE

## 2021-05-09 ENCOUNTER — Encounter: Payer: Self-pay | Admitting: Family Medicine

## 2021-05-10 NOTE — Progress Notes (Signed)
Patient did not keep appointment today. She may call to reschedule.  

## 2021-06-03 ENCOUNTER — Encounter: Payer: BC Managed Care – PPO | Admitting: Obstetrics

## 2021-07-16 IMAGING — US US PELVIS COMPLETE WITH TRANSVAGINAL
1 series · 15 of 25 positions shown · non-contrast
Comparison: CT from earlier in the same day.

CLINICAL DATA: Pelvic pain, possible hydrosalpinx on recent CT



[Series 1: us pelvis complete · 15 of 80 slices shown]
[im 1/80]
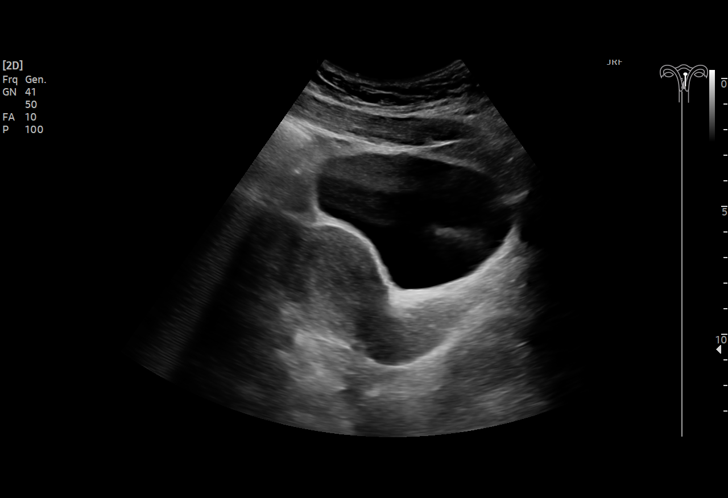
[im 7/80]
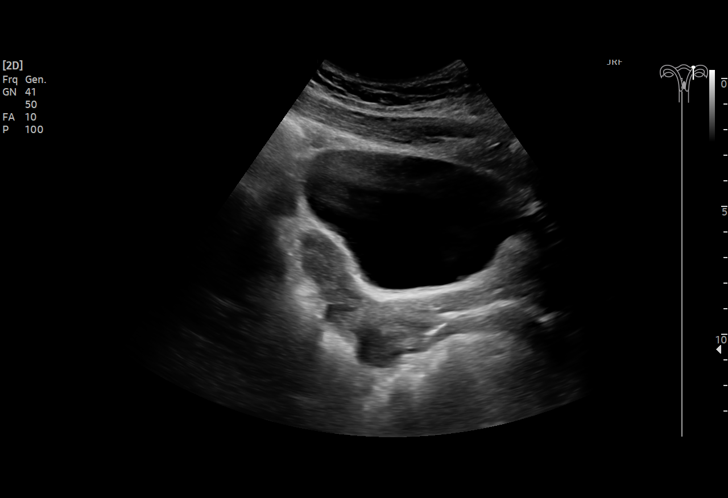
[im 14/80]
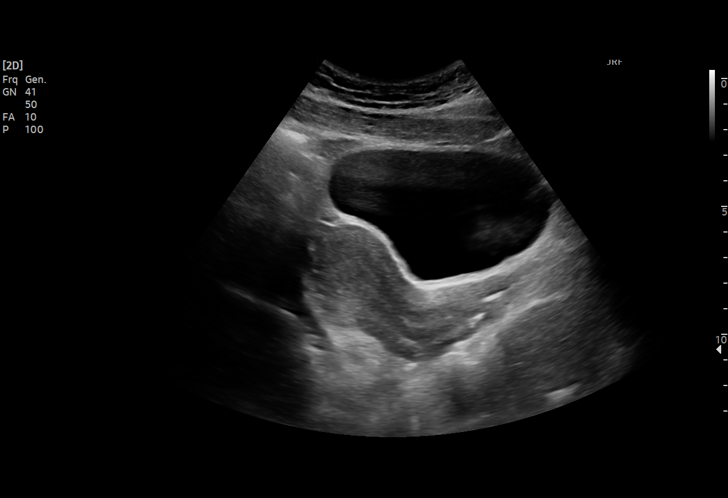
[im 17/80]
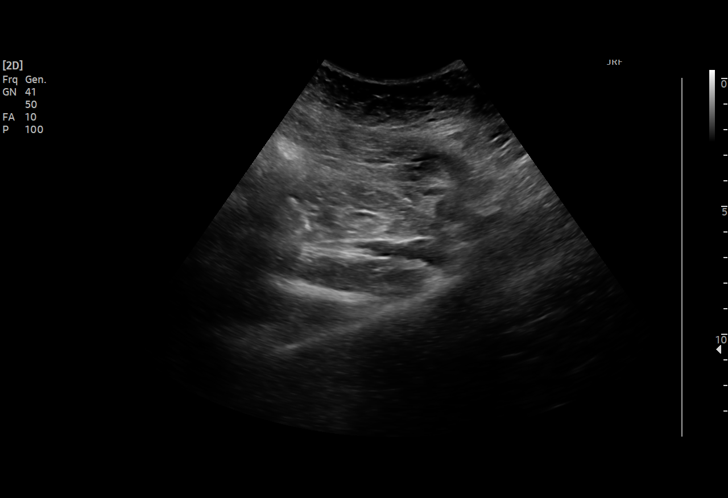
[im 24/80]
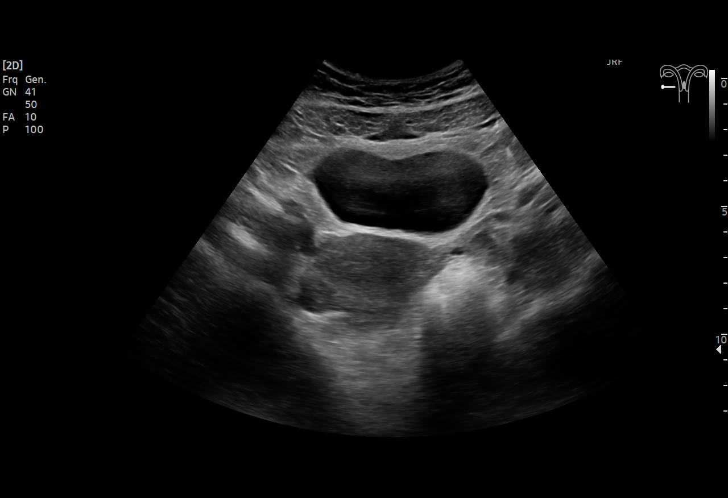
[im 30/80]
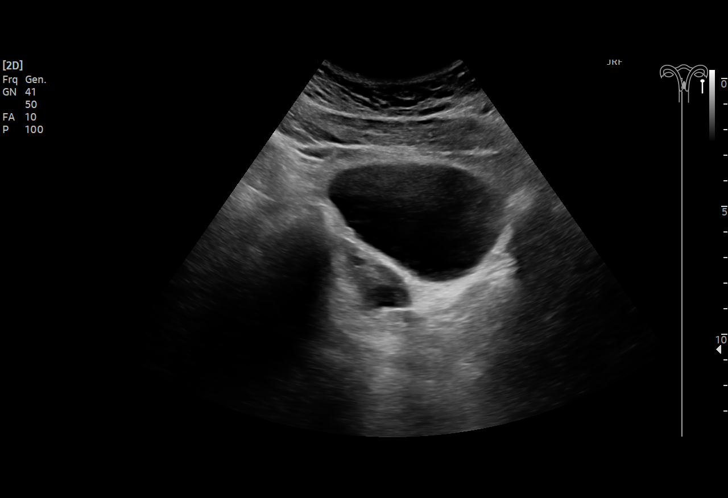
[im 33/80]
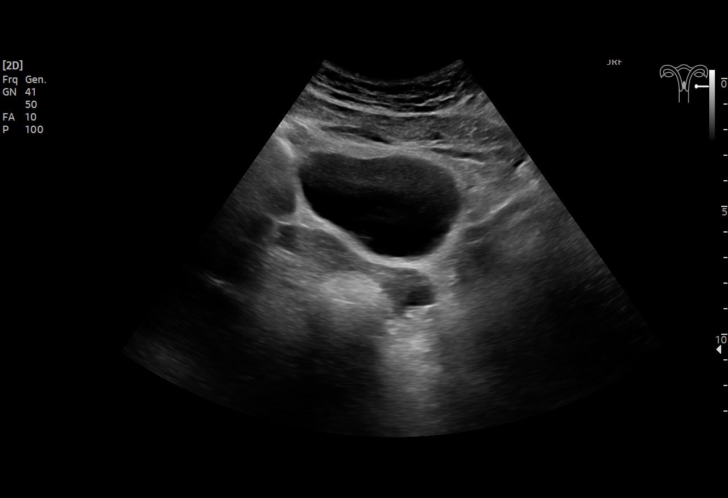
[im 40/80]
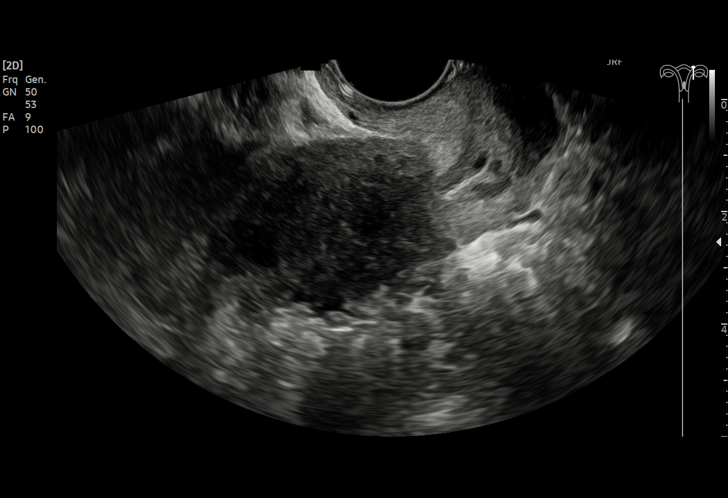
[im 47/80]
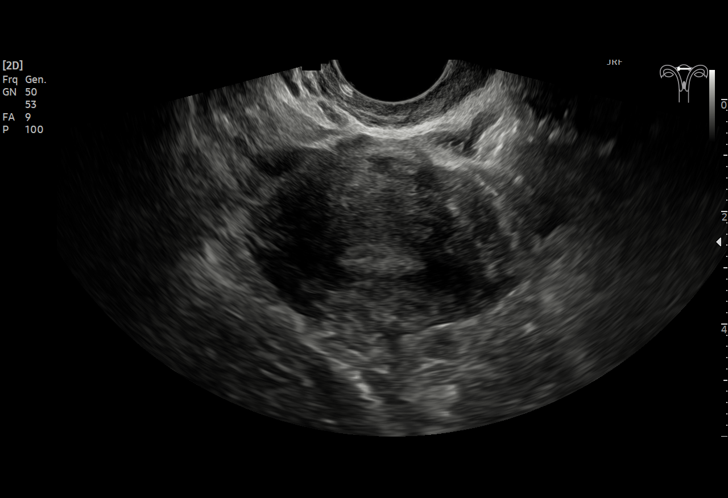
[im 50/80]
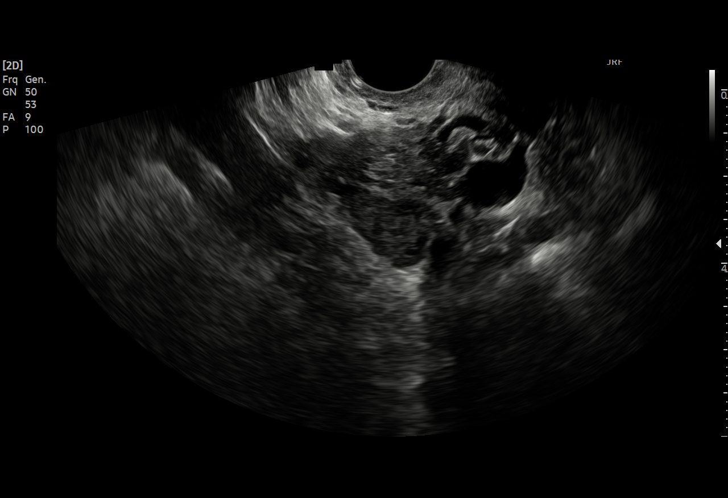
[im 56/80]
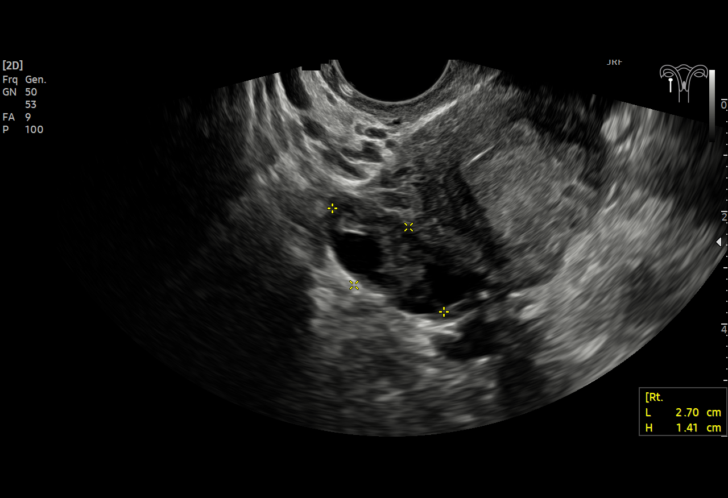
[im 63/80]
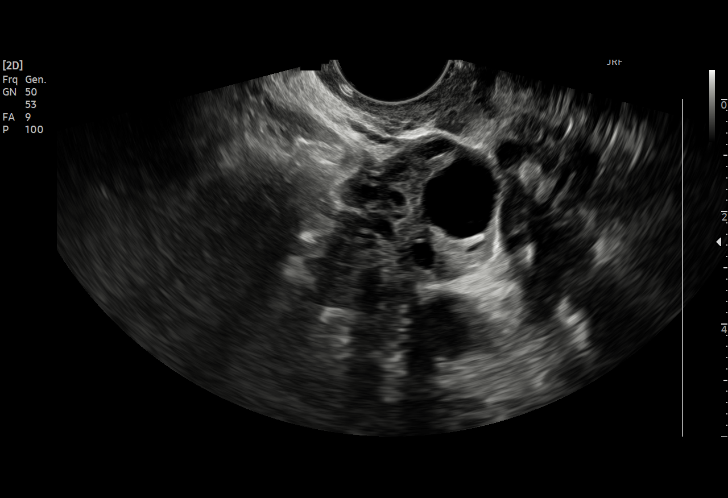
[im 66/80]
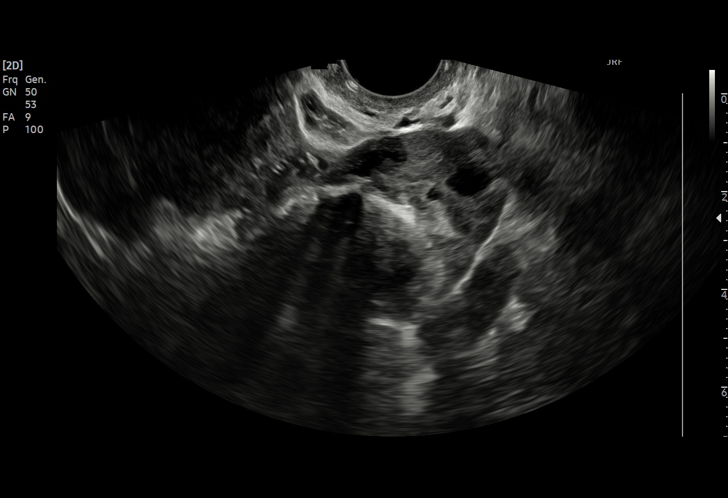
[im 73/80]
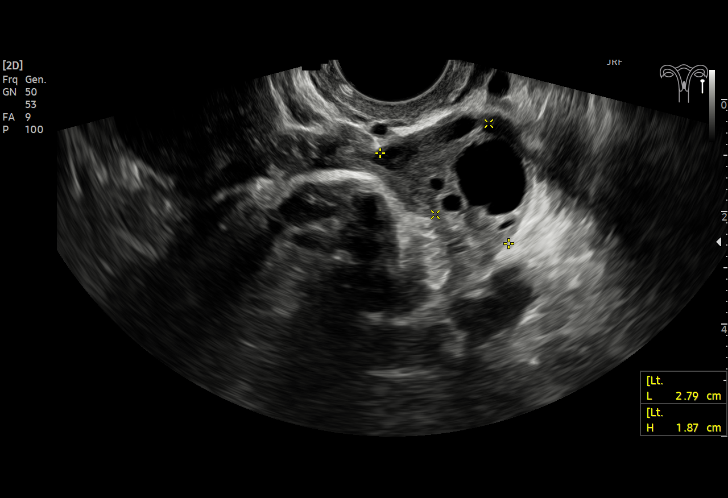
[im 80/80]
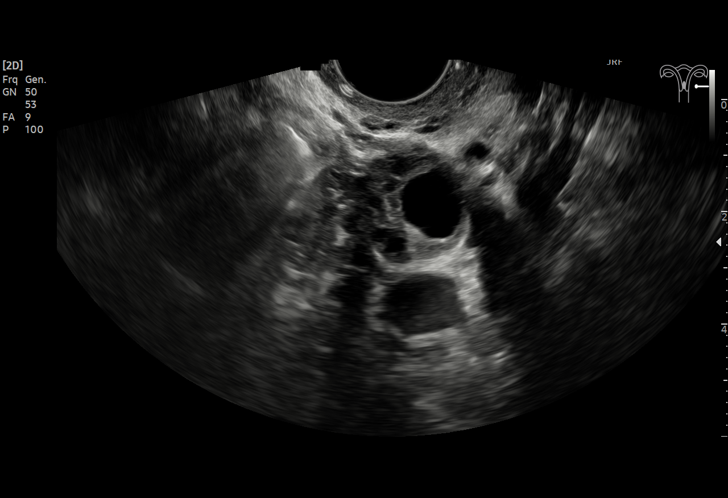

[15 of 25 positions shown; findings below may reference images not displayed]

FINDINGS: Uterus

Measurements: 6.5 x 3.4 x 4.7 cm. = volume: 54 mL. No fibroids or
other mass visualized.

Endometrium

Thickness: 7.1 mm.  No focal abnormality visualized.

Right ovary

Measurements: 2.7 x 1.4 x 1.9 cm. = volume: 4 mL. Normal
appearance/no adnexal mass.

Left ovary

Measurements: 2.8 x 1.9 x 2.3 cm. = volume: 6 mL. No ovarian mass
lesion is noted. Small tubular structure is noted adjacent to the
left ovary corresponding to that seen on prior CT examination. This
likely represents a small hydrosalpinx.

Other findings

No abnormal free fluid.
IMPRESSION: Small left hydrosalpinx.  No other focal abnormality is noted.

## 2022-06-20 ENCOUNTER — Ambulatory Visit
Admission: EM | Admit: 2022-06-20 | Discharge: 2022-06-20 | Disposition: A | Discharge status: Home / Self Care | Payer: BC Managed Care – PPO | Attending: Emergency Medicine | Admitting: Emergency Medicine

## 2022-06-20 DIAGNOSIS — R051 Acute cough: Secondary | ICD-10-CM

## 2022-06-20 DIAGNOSIS — R0602 Shortness of breath: Secondary | ICD-10-CM

## 2022-06-20 DIAGNOSIS — N939 Abnormal uterine and vaginal bleeding, unspecified: Secondary | ICD-10-CM

## 2022-06-20 LAB — POCT URINE PREGNANCY: Preg Test, Ur: NEGATIVE

## 2022-06-20 LAB — POCT URINALYSIS DIP (MANUAL ENTRY)
Bilirubin, UA: NEGATIVE
Glucose, UA: NEGATIVE mg/dL
Ketones, POC UA: NEGATIVE mg/dL
Leukocytes, UA: NEGATIVE
Nitrite, UA: NEGATIVE
Protein Ur, POC: NEGATIVE mg/dL
Spec Grav, UA: 1.025 (ref 1.010–1.025)
Urobilinogen, UA: 0.2 E.U./dL
pH, UA: 5.5 (ref 5.0–8.0)

## 2022-06-20 MED ORDER — ALBUTEROL SULFATE HFA 108 (90 BASE) MCG/ACT IN AERS: 1.0000 | INHALATION_SPRAY | Freq: Four times a day (QID) | RESPIRATORY_TRACT | 0 refills | Status: AC | PRN

## 2022-06-20 NOTE — Discharge Instructions (Addendum)
Use the albuterol inhaler as directed.    Go to the emergency department if you have worsening symptoms.    Your vaginal tests are pending.  If your test results are positive, we will call you.  You and your sexual partner(s) may require treatment at that time.  Do not have sexual activity for at least 7 days.     Establish a primary care provider and a gynecologist as soon as possible.

## 2022-06-20 NOTE — ED Provider Notes (Signed)
[facility]    CSN: [id number] Arrival date & time: 06/20/22  0844      History   Chief Complaint Chief Complaint  Patient presents with   Shortness of Breath    HPI Felicia Avila is a 26 y.o. female.  Patient presents with 1 week history of mild cough and shortness of breath.  Her symptoms are intermittent and mild; no aggravating or alleviating factors identified; no associated symptoms.  She vapes daily.  No history of lung disease.  She denies chest pain, fever, chills, ear pain, sore throat.  Patient also presents with vaginal bleeding which she describes as "spotting" this morning after having intercourse last night.  She requests STD testing.  No vaginal discharge prior to this.  She denies abdominal pain, nausea, vomiting, diarrhea to patient, pelvic pain, dysuria, flank pain, or other symptoms.  No treatments at home.  The history is provided by the patient and medical records.    Past Medical History:  Diagnosis Date   Hx of ovarian cyst    Scoliosis     There are no problems to display for this patient.   History reviewed. No pertinent surgical history.  OB History     Gravida  0   Para  0   Term  0   Preterm  0   AB  0   Living  0      SAB  0   IAB  0   Ectopic  0   Multiple  0   Live Births  0            Home Medications    Prior to Admission medications   Medication Sig Start Date End Date Taking? Authorizing Provider  albuterol (VENTOLIN HFA) 108 (90 Base) MCG/ACT inhaler Inhale 1-2 puffs into the lungs every 6 (six) hours as needed. 06/20/22  Yes Sharion Balloon, NP    Family History History reviewed. No pertinent family history.  Social History Social History   Tobacco Use   Smoking status: Former   Smokeless tobacco: Never  Scientific laboratory technician Use: Every day  Substance Use Topics   Alcohol use: Yes    Alcohol/week: 4.0 standard drinks of alcohol    Types: 4 Glasses of wine per week   Drug use: Never      Allergies   Patient has no known allergies.   Review of Systems Review of Systems  Constitutional:  Negative for chills and fever.  HENT:  Negative for ear pain and sore throat.   Respiratory:  Positive for cough and shortness of breath.   Cardiovascular:  Negative for chest pain and palpitations.  Gastrointestinal:  Negative for abdominal pain, constipation, diarrhea, nausea and vomiting.  Genitourinary:  Positive for vaginal bleeding. Negative for dysuria, flank pain, hematuria, pelvic pain and vaginal discharge.  Skin:  Negative for color change and rash.  All other systems reviewed and are negative.    Physical Exam Triage Vital Signs ED Triage Vitals  Enc Vitals Group     BP      Pulse      Resp      Temp      Temp src      SpO2      Weight      Height      Head Circumference      Peak Flow      Pain Score      Pain Loc  Pain Edu?      Excl. in [place]?    No data found.  Updated Vital Signs BP 123/87   Pulse 99   Temp 98.7 F (37.1 C)   Resp 18   Ht '5\' 4"'$  (1.626 m)   Wt 162 lb (73.5 kg)   LMP 06/07/2022   SpO2 98%   BMI 27.81 kg/m   Visual Acuity Right Eye Distance:   Left Eye Distance:   Bilateral Distance:    Right Eye Near:   Left Eye Near:    Bilateral Near:     Physical Exam Vitals and nursing note reviewed.  Constitutional:      General: She is not in acute distress.    Appearance: Normal appearance. She is well-developed. She is not ill-appearing.  HENT:     Right Ear: Tympanic membrane normal.     Left Ear: Tympanic membrane normal.     Nose: Nose normal.     Mouth/Throat:     Mouth: Mucous membranes are moist.     Pharynx: Oropharynx is clear.  Eyes:     Conjunctiva/sclera: Conjunctivae normal.  Cardiovascular:     Rate and Rhythm: Normal rate and regular rhythm.     Heart sounds: Normal heart sounds.  Pulmonary:     Effort: Pulmonary effort is normal. No respiratory distress.     Breath sounds: Normal breath sounds.  No wheezing, rhonchi or rales.  Abdominal:     General: Bowel sounds are normal.     Palpations: Abdomen is soft.     Tenderness: There is no abdominal tenderness. There is no right CVA tenderness, left CVA tenderness, guarding or rebound.  Musculoskeletal:     Cervical back: Neck supple.  Skin:    General: Skin is warm and dry.  Neurological:     Mental Status: She is alert.  Psychiatric:        Mood and Affect: Mood normal.        Behavior: Behavior normal.      UC Treatments / Results  Labs (all labs ordered are listed, but only abnormal results are displayed) Labs Reviewed  POCT URINALYSIS DIP (MANUAL ENTRY) - Abnormal; Notable for the following components:      Result Value   Color, UA light yellow (*)    Blood, UA large (*)    All other components within normal limits  POCT URINE PREGNANCY  CERVICOVAGINAL ANCILLARY ONLY    EKG   Radiology No results found.  Procedures Procedures (including critical care time)  Medications Ordered in UC Medications - No data to display  Initial Impression / Assessment and Plan / UC Course  I have reviewed the triage vital signs and the nursing notes.  Pertinent labs & imaging results that were available during my care of the patient were reviewed by me and considered in my medical decision making (see chart for details).    Shortness of breath, cough, abnormal vaginal bleeding.  Patient is well-appearing and her exam is reassuring.  Afebrile and vital signs are stable.  Treating shortness of breath with albuterol inhaler.  Encouraged patient to stop vaping.  Urine pregnancy negative.  Urine does not show signs of infection.  Patient obtained vaginal self swab for testing.  Discussed that we will call if test results are positive.  Discussed that she may require treatment at that time.  Discussed that sexual partner(s) may also require treatment.  Instructed patient to abstain from sexual activity for at least 7 days.  Instructed  her to establish a PCP and gynecologist for follow up.  ED precautions discussed.  Education provided on shortness of breath and abnormal vaginal bleeding.  Patient agrees to plan of care.   Final Clinical Impressions(s) / UC Diagnoses   Final diagnoses:  Shortness of breath  Acute cough  Abnormal vaginal bleeding     Discharge Instructions      Use the albuterol inhaler as directed.    Go to the emergency department if you have worsening symptoms.    Your vaginal tests are pending.  If your test results are positive, we will call you.  You and your sexual partner(s) may require treatment at that time.  Do not have sexual activity for at least 7 days.     Establish a primary care provider and a gynecologist as soon as possible.       ED Prescriptions     Medication Sig Dispense Auth. Provider   albuterol (VENTOLIN HFA) 108 (90 Base) MCG/ACT inhaler Inhale 1-2 puffs into the lungs every 6 (six) hours as needed. 18 g Sharion Balloon, NP      PDMP not reviewed this encounter.   Sharion Balloon, NP 06/20/22 [phone]

## 2022-06-20 NOTE — ED Triage Notes (Signed)
Patient to Urgent Care with complaints of shortness of breath x7 days. Reports slight dry cough. Reports SHOB is at rest and with exertion. Denies any history of the same. Denies any chest pain.   Also requested STD screening, reports intercourse last night and had some unusual bleeding. Denies any vaginal discharge.

## 2022-06-23 LAB — CERVICOVAGINAL ANCILLARY ONLY
Bacterial Vaginitis (gardnerella): NEGATIVE
Candida Glabrata: NEGATIVE
Candida Vaginitis: NEGATIVE
Chlamydia: NEGATIVE
Comment: NEGATIVE
Comment: NEGATIVE
Comment: NEGATIVE
Comment: NEGATIVE
Comment: NEGATIVE
Comment: NORMAL
Neisseria Gonorrhea: NEGATIVE
Trichomonas: NEGATIVE

## 2022-10-02 DIAGNOSIS — Z86018 Personal history of other benign neoplasm: Secondary | ICD-10-CM

## 2022-10-02 DIAGNOSIS — D649 Anemia, unspecified: Secondary | ICD-10-CM

## 2022-10-02 HISTORY — DX: Personal history of other benign neoplasm: Z86.018

## 2022-10-02 HISTORY — DX: Anemia, unspecified: D64.9

## 2022-10-10 ENCOUNTER — Other Ambulatory Visit: Payer: Self-pay

## 2022-10-10 ENCOUNTER — Emergency Department
Admission: EM | Admit: 2022-10-10 | Discharge: 2022-10-11 | Disposition: A | Discharge status: Home / Self Care | Payer: Self-pay | Attending: Emergency Medicine | Admitting: Emergency Medicine

## 2022-10-10 ENCOUNTER — Emergency Department: Payer: Self-pay

## 2022-10-10 DIAGNOSIS — D219 Benign neoplasm of connective and other soft tissue, unspecified: Secondary | ICD-10-CM

## 2022-10-10 DIAGNOSIS — D259 Leiomyoma of uterus, unspecified: Secondary | ICD-10-CM | POA: Insufficient documentation

## 2022-10-10 DIAGNOSIS — N940 Mittelschmerz: Secondary | ICD-10-CM | POA: Insufficient documentation

## 2022-10-10 DIAGNOSIS — N83201 Unspecified ovarian cyst, right side: Secondary | ICD-10-CM | POA: Insufficient documentation

## 2022-10-10 DIAGNOSIS — N939 Abnormal uterine and vaginal bleeding, unspecified: Secondary | ICD-10-CM

## 2022-10-10 LAB — CBC
HCT: 32.5 % — ABNORMAL LOW (ref 36.0–46.0)
Hemoglobin: 9.9 g/dL — ABNORMAL LOW (ref 12.0–15.0)
MCH: 24 pg — ABNORMAL LOW (ref 26.0–34.0)
MCHC: 30.5 g/dL (ref 30.0–36.0)
MCV: 78.7 fL — ABNORMAL LOW (ref 80.0–100.0)
Platelets: 404 10*3/uL — ABNORMAL HIGH (ref 150–400)
RBC: 4.13 MIL/uL (ref 3.87–5.11)
RDW: 17.5 % — ABNORMAL HIGH (ref 11.5–15.5)
WBC: 8.2 10*3/uL (ref 4.0–10.5)
nRBC: 0 % (ref 0.0–0.2)

## 2022-10-10 LAB — COMPREHENSIVE METABOLIC PANEL
ALT: 10 U/L (ref 0–44)
AST: 17 U/L (ref 15–41)
Albumin: 3.6 g/dL (ref 3.5–5.0)
Alkaline Phosphatase: 60 U/L (ref 38–126)
Anion gap: 8 (ref 5–15)
BUN: 7 mg/dL (ref 6–20)
CO2: 24 mmol/L (ref 22–32)
Calcium: 8.3 mg/dL — ABNORMAL LOW (ref 8.9–10.3)
Chloride: 98 mmol/L (ref 98–111)
Creatinine, Ser: 0.72 mg/dL (ref 0.44–1.00)
GFR, Estimated: >60 mL/min (ref >60)
Glucose, Bld: 99 mg/dL (ref 70–99)
Potassium: 3.2 mmol/L — ABNORMAL LOW (ref 3.5–5.1)
Sodium: 130 mmol/L — ABNORMAL LOW (ref 135–145)
Total Bilirubin: 0.4 mg/dL (ref 0.3–1.2)
Total Protein: 7.7 g/dL (ref 6.5–8.1)

## 2022-10-10 LAB — URINALYSIS, ROUTINE W REFLEX MICROSCOPIC
Bilirubin Urine: NEGATIVE
Glucose, UA: NEGATIVE mg/dL
Hgb urine dipstick: NEGATIVE
Ketones, ur: NEGATIVE mg/dL
Leukocytes,Ua: NEGATIVE
Nitrite: NEGATIVE
Protein, ur: NEGATIVE mg/dL
Specific Gravity, Urine: 1.021 (ref 1.005–1.030)
pH: 6 (ref 5.0–8.0)

## 2022-10-10 LAB — CHLAMYDIA/NGC RT PCR (ARMC ONLY)
Chlamydia Tr: NOT DETECTED
N gonorrhoeae: NOT DETECTED

## 2022-10-10 LAB — WET PREP, GENITAL
Clue Cells Wet Prep HPF POC: NONE SEEN
Sperm: NONE SEEN
Trich, Wet Prep: NONE SEEN
WBC, Wet Prep HPF POC: <10 — AB (ref <10)
Yeast Wet Prep HPF POC: NONE SEEN

## 2022-10-10 LAB — LIPASE, BLOOD: Lipase: 38 U/L (ref 11–51)

## 2022-10-10 LAB — POC URINE PREG, ED: Preg Test, Ur: NEGATIVE

## 2022-10-10 MED ORDER — POTASSIUM CHLORIDE CRYS ER 20 MEQ PO TBCR
40.0000 meq | EXTENDED_RELEASE_TABLET | Freq: Once | ORAL | Status: AC
  Administered 2022-10-10: 40 meq via ORAL
  Filled 2022-10-10: qty 2

## 2022-10-10 NOTE — Discharge Instructions (Signed)
You were seen in the emergency room for abdominal pain which I suspect is related to your menstrual cycle and a small cyst on your right ovary. It is important that you follow up closely with an obstetrician.    Please return to the emergency room right away if you are to develop a fever, severe nausea, your pain returns of any pain becomes severe or worsens, you are unable to keep food down, begin vomiting any dark or bloody fluid, you develop any dark or bloody stools, feel dehydrated, or other new concerns or symptoms arise.

## 2022-10-10 NOTE — ED Notes (Signed)
Pt requested to have her IV taken out,stating that it hurts. Explained to pt that if she needs anything via IV while in the ER we will need to place another one- pt consented and insisted to having it removed.

## 2022-10-10 NOTE — ED Triage Notes (Signed)
Pt to ED via POV c/o RLQ abd pain. On/off stabbing abd pain started today with some vaginal bleeding as well. Pt has hx of ovarian cyst. Pt in NAD at this time. Denies cp, sob, fevers, diarrhea

## 2022-10-10 NOTE — ED Provider Notes (Signed)
[facility] Provider Note    Event Date/Time   First MD Initiated Contact with Patient 10/10/22 2008     (approximate)   History   Vaginal Bleeding and Abdominal Pain   HPI  Felicia Avila is a 27 y.o. female reports a history of previous ovarian cysts  Her last menstrual cycle ended normally about 1 week ago.  Today she reported she had a fairly sharp sudden pain in her right lower pelvis.  It is since resolved but when it happened it was quite severe and right afterwards she noticed an unusual slightly darkened vaginal discharge about that of a typical menstrual cycle that has since improved.  The pain and discomfort have all gone away but she reports she had a relatively sharp pain in her lower pelvis.  No fevers or chills no pain or burning with urination.  No chest pain.  Denies abdominal pain or any ongoing issues at this point but reports she was quite severe and sharp earlier and has now abated  She denies previous history of pregnancy.  She does not believe she is pregnant tonight     Physical Exam   Triage Vital Signs: ED Triage Vitals  Enc Vitals Group     BP 10/10/22 1913 (!) 135/96     Pulse Rate 10/10/22 1913 92     Resp 10/10/22 1913 14     Temp 10/10/22 1913 98.8 F (37.1 C)     Temp Source 10/10/22 1913 Oral     SpO2 10/10/22 1913 100 %     Weight 10/10/22 1914 165 lb (74.8 kg)     Height 10/10/22 1914 5' 3"$  (1.6 m)     Head Circumference --      Peak Flow --      Pain Score 10/10/22 1914 2     Pain Loc --      Pain Edu? --      Excl. in [place]? --     Most recent vital signs: Vitals:   10/10/22 1913 10/10/22 2000  BP: (!) 135/96 138/89  Pulse: 92   Resp: 14   Temp: 98.8 F (37.1 C)   SpO2: 100%    Preg test interpreted as negative   General: Awake, no distress.  CV:  Good peripheral perfusion.  Resp:  Normal effort.  Abd:  No distention.  No tenderness to percussion of costovertebral angles bilaterally.  Abdomen  soft nontender nondistended throughout.  No pain in any quadrant at this time other than she reports an urge to urinate with palpation in the suprapubic space Vaginal exam escorted by RN Apolonio Schneiders.  Normal external exam.  Normal internal exam os and cervix with exception to very scant amount of clotted blood.  No active bleeding.  No tenderness or discomfort.  No purulence Other:     ED Results / Procedures / Treatments   Labs (all labs ordered are listed, but only abnormal results are displayed) Labs Reviewed  WET PREP, GENITAL - Abnormal; Notable for the following components:      Result Value   WBC, Wet Prep HPF POC <10 (*)    All other components within normal limits  COMPREHENSIVE METABOLIC PANEL - Abnormal; Notable for the following components:   Sodium 130 (*)    Potassium 3.2 (*)    Calcium 8.3 (*)    All other components within normal limits  CBC - Abnormal; Notable for the following components:   Hemoglobin 9.9 (*)  HCT 32.5 (*)    MCV 78.7 (*)    MCH 24.0 (*)    RDW 17.5 (*)    Platelets 404 (*)    All other components within normal limits  URINALYSIS, ROUTINE W REFLEX MICROSCOPIC - Abnormal; Notable for the following components:   Color, Urine YELLOW (*)    APPearance CLEAR (*)    All other components within normal limits  CHLAMYDIA/NGC RT PCR ([facility] ONLY)            LIPASE, BLOOD  POC URINE PREG, ED   Labs are notable for mild hyponatremia and hypokalemia.  Sodium 130 but she is asymptomatic.  Potassium 3.2.  Hemoglobin 9.9 room but does have an underlying history of mild anemia, and currently reports no further bleeding  EKG     RADIOLOGY  US PELVIC COMPLETE W TRANSVAGINAL AND TORSION R/O  Result Date: 10/10/2022 CLINICAL DATA:  Vaginal bleeding EXAM: TRANSABDOMINAL AND TRANSVAGINAL ULTRASOUND OF PELVIS DOPPLER ULTRASOUND OF OVARIES TECHNIQUE: Both transabdominal and transvaginal ultrasound examinations of the pelvis were performed. Transabdominal technique  was performed for global imaging of the pelvis including uterus, ovaries, adnexal regions, and pelvic cul-de-sac. It was necessary to proceed with endovaginal exam following the transabdominal exam to visualize the ovaries and endometrium. Color and duplex Doppler ultrasound was utilized to evaluate blood flow to the ovaries. COMPARISON:  Pelvic ultrasound 11/07/2020. CT abdomen and pelvis 11/07/2020. FINDINGS: Uterus Measurements: 7.4 x 4.1 x 5.2 cm = volume: 82 mL. There is a subserosal fibroid in the left fundal region measuring 2.4 x 2.4 x 2.2 cm. Endometrium Thickness: 4.3 mm.  No focal abnormality visualized. Right ovary Measurements: 3.4 x 2.3 x 2.0 cm = volume: 8.4 mL. There is a 1.9 x 1.7 x 1.4 cm cyst or dominant follicle in the right ovary with thin septation. Left ovary Measurements: 2.7 x 1.4 x 2.6 cm = volume: 5.1 mL. Normal appearance/no adnexal mass. Pulsed Doppler evaluation of both ovaries demonstrates normal low-resistance arterial and venous waveforms. Other findings No abnormal free fluid. IMPRESSION: 1. There is a subserosal fibroid in the left fundal region measuring 2.4 x 2.4 x 2.2 cm. 2. There is a 1.9 cm cyst or dominant follicle in the right ovary with thin septation. No follow-up necessary. Electronically Signed   By: Ronney Asters M.D.   On: 10/10/2022 21:48    Imaging reviewed.  Notable for fibroid and also notation of a dominant follicle in the right ovary.   PROCEDURES:  Critical Care performed: No  Procedures   MEDICATIONS ORDERED IN ED: Medications  potassium chloride SA (KLOR-CON M) CR tablet 40 mEq (40 mEq Oral Given 10/10/22 2321)     IMPRESSION / MDM / ASSESSMENT AND PLAN / ED COURSE  I reviewed the triage vital signs and the nursing notes.                             Negative pregnancy test.  Differential diagnosis includes, but is not limited to, what appears to be a likely gynecologic etiology.  Negative pregnancy test.  Reports a history of ovarian cyst  with a sharp pain in the right lower pelvis that is now resolved and had short period of menstrual-like bleeding that has improved.  Order transvaginal ultrasound to evaluate for pathology hemorrhagic cyst, mittelschmerz, rule out ovarian torsion large cyst etc.  The fact that she is currently asymptomatic and pain has resolved with reassuring.  Patient's presentation is  most consistent with acute complicated illness / injury requiring diagnostic workup.  Discussed with patient and she advises she does not have much for drain in her diet.  Recommended she consider adding fruits and vegetables to her diet as her potassium is somewhat low.  She is alert well-oriented, will give 1 dose of potassium prior to discharge.  Advised on follow-up and careful return precautions relating to fibroid and also suspected pain related to potentially a small cyst or mittelschmerz-like phenomena.  Patient comfortable with plan.  She advises she is actually working to establish OB/GYN care in the area at this time, provided follow-up recommendations and that she could call follow-up with Dr. Leticia Penna or other provider.  Discussed careful abdominal pain return precautions including if she has any persistent or severe right lower quadrant pain develop fevers vomiting or other symptoms she should return to the ER right away.  Abdomen soft nontender nondistended on examination she is asymptomatic at this time.  Return precautions and treatment recommendations and follow-up discussed with the patient who is agreeable with the plan.         FINAL CLINICAL IMPRESSION(S) / ED DIAGNOSES   Final diagnoses:  Mittelschmerz phenomenon  Ovarian cyst, right  Fibroid     Rx / DC Orders   ED Discharge Orders     None        Note:  This document was prepared using Dragon voice recognition software and may include unintentional dictation errors.   Delman Kitten, MD 10/10/22 2340

## 2023-10-18 ENCOUNTER — Encounter: Payer: Self-pay | Admitting: Emergency Medicine

## 2023-10-18 ENCOUNTER — Emergency Department: Payer: Self-pay

## 2023-10-18 ENCOUNTER — Other Ambulatory Visit: Payer: Self-pay

## 2023-10-18 ENCOUNTER — Emergency Department
Admission: EM | Admit: 2023-10-18 | Discharge: 2023-10-18 | Disposition: A | Discharge status: Home / Self Care | Payer: Self-pay | Attending: Emergency Medicine | Admitting: Emergency Medicine

## 2023-10-18 DIAGNOSIS — R102 Pelvic and perineal pain: Secondary | ICD-10-CM | POA: Insufficient documentation

## 2023-10-18 DIAGNOSIS — O26891 Other specified pregnancy related conditions, first trimester: Secondary | ICD-10-CM | POA: Insufficient documentation

## 2023-10-18 DIAGNOSIS — Z3A01 Less than 8 weeks gestation of pregnancy: Secondary | ICD-10-CM | POA: Insufficient documentation

## 2023-10-18 DIAGNOSIS — Z3491 Encounter for supervision of normal pregnancy, unspecified, first trimester: Secondary | ICD-10-CM

## 2023-10-18 LAB — BASIC METABOLIC PANEL
Anion gap: 11 (ref 5–15)
BUN: 10 mg/dL (ref 6–20)
CO2: 21 mmol/L — ABNORMAL LOW (ref 22–32)
Calcium: 8.8 mg/dL — ABNORMAL LOW (ref 8.9–10.3)
Chloride: 104 mmol/L (ref 98–111)
Creatinine, Ser: 0.7 mg/dL (ref 0.44–1.00)
GFR, Estimated: >60 mL/min (ref >60)
Glucose, Bld: 94 mg/dL (ref 70–99)
Potassium: 3.4 mmol/L — ABNORMAL LOW (ref 3.5–5.1)
Sodium: 136 mmol/L (ref 135–145)

## 2023-10-18 LAB — WET PREP, GENITAL
Clue Cells Wet Prep HPF POC: NONE SEEN
Sperm: NONE SEEN
Trich, Wet Prep: NONE SEEN
WBC, Wet Prep HPF POC: <10 (ref <10)
Yeast Wet Prep HPF POC: NONE SEEN

## 2023-10-18 LAB — CBC WITH DIFFERENTIAL/PLATELET
Abs Immature Granulocytes: 0.01 10*3/uL (ref 0.00–0.07)
Basophils Absolute: 0.1 10*3/uL (ref 0.0–0.1)
Basophils Relative: 1 %
Eosinophils Absolute: 0.2 10*3/uL (ref 0.0–0.5)
Eosinophils Relative: 3 %
HCT: 31 % — ABNORMAL LOW (ref 36.0–46.0)
Hemoglobin: 9.5 g/dL — ABNORMAL LOW (ref 12.0–15.0)
Immature Granulocytes: 0 %
Lymphocytes Relative: 39 %
Lymphs Abs: 2.1 10*3/uL (ref 0.7–4.0)
MCH: 23.3 pg — ABNORMAL LOW (ref 26.0–34.0)
MCHC: 30.6 g/dL (ref 30.0–36.0)
MCV: 76.2 fL — ABNORMAL LOW (ref 80.0–100.0)
Monocytes Absolute: 0.6 10*3/uL (ref 0.1–1.0)
Monocytes Relative: 11 %
Neutro Abs: 2.5 10*3/uL (ref 1.7–7.7)
Neutrophils Relative %: 46 %
Platelets: 426 10*3/uL — ABNORMAL HIGH (ref 150–400)
RBC: 4.07 MIL/uL (ref 3.87–5.11)
RDW: 17.8 % — ABNORMAL HIGH (ref 11.5–15.5)
WBC: 5.4 10*3/uL (ref 4.0–10.5)
nRBC: 0 % (ref 0.0–0.2)

## 2023-10-18 LAB — URINALYSIS, ROUTINE W REFLEX MICROSCOPIC
Bilirubin Urine: NEGATIVE
Glucose, UA: NEGATIVE mg/dL
Hgb urine dipstick: NEGATIVE
Ketones, ur: 20 mg/dL — AB
Leukocytes,Ua: NEGATIVE
Nitrite: NEGATIVE
Protein, ur: NEGATIVE mg/dL
Specific Gravity, Urine: 1.031 — ABNORMAL HIGH (ref 1.005–1.030)
pH: 5 (ref 5.0–8.0)

## 2023-10-18 LAB — CHLAMYDIA/NGC RT PCR (ARMC ONLY)
Chlamydia Tr: NOT DETECTED
N gonorrhoeae: NOT DETECTED

## 2023-10-18 LAB — HCG, QUANTITATIVE, PREGNANCY: hCG, Beta Chain, Quant, S: 5669 m[IU]/mL — ABNORMAL HIGH (ref <5)

## 2023-10-18 LAB — POC URINE PREG, ED: Preg Test, Ur: POSITIVE — AB

## 2023-10-18 NOTE — ED Triage Notes (Signed)
 Patient to ED via POV for vaginal pain. States pain on the inside and feels like before her period starts but a little worse. States she just found out she is pregnant- states last period 09/16/23. Denies bleeding or discharge. States no pain at this time.

## 2023-10-18 NOTE — ED Provider Notes (Signed)
 [facility] Provider Note    Event Date/Time   First MD Initiated Contact with Patient 10/18/23 1102     (approximate)   History   Vaginal Pain   HPI  Felicia Avila is a 28 y.o. female with no significant past medical history and as listed in EMR presents to the emergency department for treatment and evaluation of vaginal pain.  Pain is similar to before menstrual cycle but seems worse.  She is pregnant with LMP 09/16/2023.  No vaginal bleeding or discharge.      Physical Exam   Triage Vital Signs: ED Triage Vitals  Encounter Vitals Group     BP 10/18/23 1029 127/84     Systolic BP Percentile --      Diastolic BP Percentile --      Pulse Rate 10/18/23 1029 96     Resp 10/18/23 1029 16     Temp 10/18/23 1029 99.3 F (37.4 C)     Temp Source 10/18/23 1029 Oral     SpO2 10/18/23 1029 100 %     Weight 10/18/23 1029 150 lb (68 kg)     Height 10/18/23 1029 5\' 3"  (1.6 m)     Head Circumference --      Peak Flow --      Pain Score 10/18/23 1032 0     Pain Loc --      Pain Education --      Exclude from Growth Chart --     Most recent vital signs: Vitals:   10/18/23 1029 10/18/23 1458  BP: 127/84 125/82  Pulse: 96 92  Resp: 16 16  Temp: 99.3 F (37.4 C) 99.2 F (37.3 C)  SpO2: 100% 100%    General: Awake, no distress.  CV:  Good peripheral perfusion.  Resp:  Normal effort.  Abd:  No distention.  Other:  Cervix is closed, no CMT, no discharge noted.   ED Results / Procedures / Treatments   Labs (all labs ordered are listed, but only abnormal results are displayed) Labs Reviewed  URINALYSIS, ROUTINE W REFLEX MICROSCOPIC - Abnormal; Notable for the following components:      Result Value   Color, Urine YELLOW (*)    APPearance HAZY (*)    Specific Gravity, Urine 1.031 (*)    Ketones, ur 20 (*)    All other components within normal limits  CBC WITH DIFFERENTIAL/PLATELET - Abnormal; Notable for the following components:    Hemoglobin 9.5 (*)    HCT 31.0 (*)    MCV 76.2 (*)    MCH 23.3 (*)    RDW 17.8 (*)    Platelets 426 (*)    All other components within normal limits  BASIC METABOLIC PANEL - Abnormal; Notable for the following components:   Potassium 3.4 (*)    CO2 21 (*)    Calcium 8.8 (*)    All other components within normal limits  HCG, QUANTITATIVE, PREGNANCY - Abnormal; Notable for the following components:   hCG, Beta Chain, Quant, S 5,669 (*)    All other components within normal limits  POC URINE PREG, ED - Abnormal; Notable for the following components:   Preg Test, Ur Positive (*)    All other components within normal limits  CHLAMYDIA/NGC RT PCR ([facility] ONLY)            WET PREP, GENITAL     EKG  Not indicated.   RADIOLOGY  Image and radiology report reviewed and interpreted  by me. Radiology report consistent with the same.  OB ultrasound shows gestational sac measuring gestational age of [redacted] weeks and 2 days.  No subchorionic hemorrhage noted.  Yolk sac and embryo not visualized.  Cardiac activity not visualized  PROCEDURES:  Critical Care performed: No  Procedures   MEDICATIONS ORDERED IN ED:  Medications - No data to display   IMPRESSION / MDM / ASSESSMENT AND PLAN / ED COURSE   I have reviewed the triage note.  Differential diagnosis includes, but is not limited to, early pregnancy, pelvic pain in pregnancy, miscarriage  Patient's presentation is most consistent with acute illness / injury with system symptoms.  28 year old female presents to the emergency department for treatment and evaluation of vaginal pain and early pregnancy.  Home pregnancy test showed positive results 5 days ago.  This is her first pregnancy.  She has had no vaginal bleeding or discharge.  Pelvic exam was unremarkable.  GC and Chlamydia swabs plus wet prep sent to the lab.  Labs including beta hCG ordered and collected.  Urinalysis is pending.  Beta-hCG is elevated to 5669.  CBC shows a  stable anemia at 9.5 with a hematocrit of 31.0. BMP without acute concerns.  Wet prep is negative.  Chlamydia and gonorrhea are negative.  Urinalysis shows 20 ketones but otherwise negative.  Results were reviewed with the patient.  She will be discharged to follow-up with gynecology.  She was encouraged to start prenatal vitamins and take Tylenol if needed for pain.  ER return precautions also discussed.      FINAL CLINICAL IMPRESSION(S) / ED DIAGNOSES   Final diagnoses:  First trimester pregnancy     Rx / DC Orders   ED Discharge Orders     None        Note:  This document was prepared using Dragon voice recognition software and may include unintentional dictation errors.   Chinita Pester, FNP 10/18/23 1506    Dionne Bucy, MD 10/18/23 1525

## 2023-10-18 NOTE — Discharge Instructions (Signed)
 Start prenatal vitamins.  You can purchase those over-the-counter.  Take Tylenol as directed on the packaging if needed for pain.  Call and schedule an appointment with gynecology.  If your pain worsens or you begin to have vaginal bleeding more than 1 pad per hour, return to the emergency department.

## 2023-10-23 ENCOUNTER — Ambulatory Visit (LOCAL_COMMUNITY_HEALTH_CENTER): Payer: Self-pay

## 2023-10-23 VITALS — BP 118/68 | Ht 63.0 in | Wt 152.5 lb

## 2023-10-23 DIAGNOSIS — Z3201 Encounter for pregnancy test, result positive: Secondary | ICD-10-CM

## 2023-10-23 DIAGNOSIS — Z3009 Encounter for other general counseling and advice on contraception: Secondary | ICD-10-CM

## 2023-10-23 MED ORDER — PRENATAL 27-0.8 MG PO TABS: 1.0000 | ORAL_TABLET | Freq: Every day | ORAL | Status: AC

## 2023-10-23 NOTE — Progress Notes (Signed)
 UPT positive. Considering [facility] for prenatal care. Positive preg packet given and reviewed.  Denies recent depression, but confirms hx depression. Accepts behavioral health resources, including contact cards for A Mariana Kaufman, LCSW and for [facility].   The patient was dispensed prenatal vitamins #100 today per [facility] Dr Lorrin Mais. I provided counseling today regarding the medication. We discussed the medication, the side effects and when to call clinic. Patient given the opportunity to ask questions. Questions answered.    Sent to clerk for presumptive elig/medicaid/preg women. Jerel Shepherd, RN

## 2023-10-24 LAB — PREGNANCY, URINE: Preg Test, Ur: POSITIVE — AB

## 2024-06-18 ENCOUNTER — Other Ambulatory Visit: Payer: Self-pay

## 2024-06-18 ENCOUNTER — Emergency Department
Admission: EM | Admit: 2024-06-18 | Discharge: 2024-06-18 | Disposition: A | Discharge status: Home / Self Care | Payer: MEDICAID | Attending: Emergency Medicine | Admitting: Emergency Medicine

## 2024-06-18 ENCOUNTER — Emergency Department: Payer: MEDICAID

## 2024-06-18 DIAGNOSIS — J40 Bronchitis, not specified as acute or chronic: Secondary | ICD-10-CM | POA: Insufficient documentation

## 2024-06-18 DIAGNOSIS — M546 Pain in thoracic spine: Secondary | ICD-10-CM | POA: Diagnosis not present

## 2024-06-18 DIAGNOSIS — Z3A08 8 weeks gestation of pregnancy: Secondary | ICD-10-CM | POA: Diagnosis not present

## 2024-06-18 DIAGNOSIS — O26891 Other specified pregnancy related conditions, first trimester: Secondary | ICD-10-CM | POA: Diagnosis present

## 2024-06-18 DIAGNOSIS — M549 Dorsalgia, unspecified: Secondary | ICD-10-CM

## 2024-06-18 DIAGNOSIS — O99511 Diseases of the respiratory system complicating pregnancy, first trimester: Secondary | ICD-10-CM | POA: Diagnosis not present

## 2024-06-18 DIAGNOSIS — M545 Low back pain, unspecified: Secondary | ICD-10-CM | POA: Diagnosis not present

## 2024-06-18 LAB — RESP PANEL BY RT-PCR (RSV, FLU A&B, COVID)  RVPGX2
Influenza A by PCR: NEGATIVE
Influenza B by PCR: NEGATIVE
Resp Syncytial Virus by PCR: NEGATIVE
SARS Coronavirus 2 by RT PCR: NEGATIVE

## 2024-06-18 LAB — HEPATIC FUNCTION PANEL
ALT: 13 U/L (ref 0–44)
AST: 28 U/L (ref 15–41)
Albumin: 3.6 g/dL (ref 3.5–5.0)
Alkaline Phosphatase: 42 U/L (ref 38–126)
Bilirubin, Direct: <0.1 mg/dL (ref 0.0–0.2)
Total Bilirubin: 0.4 mg/dL (ref 0.0–1.2)
Total Protein: 7.9 g/dL (ref 6.5–8.1)

## 2024-06-18 LAB — POC URINE PREG, ED: Preg Test, Ur: POSITIVE — AB

## 2024-06-18 LAB — BASIC METABOLIC PANEL WITH GFR
Anion gap: 11 (ref 5–15)
BUN: 6 mg/dL (ref 6–20)
CO2: 22 mmol/L (ref 22–32)
Calcium: 8.8 mg/dL — ABNORMAL LOW (ref 8.9–10.3)
Chloride: 103 mmol/L (ref 98–111)
Creatinine, Ser: 0.59 mg/dL (ref 0.44–1.00)
GFR, Estimated: >60 mL/min (ref >60)
Glucose, Bld: 85 mg/dL (ref 70–99)
Potassium: 3.2 mmol/L — ABNORMAL LOW (ref 3.5–5.1)
Sodium: 136 mmol/L (ref 135–145)

## 2024-06-18 LAB — CBC
HCT: 31.7 % — ABNORMAL LOW (ref 36.0–46.0)
Hemoglobin: 9.6 g/dL — ABNORMAL LOW (ref 12.0–15.0)
MCH: 22.2 pg — ABNORMAL LOW (ref 26.0–34.0)
MCHC: 30.3 g/dL (ref 30.0–36.0)
MCV: 73.2 fL — ABNORMAL LOW (ref 80.0–100.0)
Platelets: 369 K/uL (ref 150–400)
RBC: 4.33 MIL/uL (ref 3.87–5.11)
RDW: 18.8 % — ABNORMAL HIGH (ref 11.5–15.5)
WBC: 4.7 K/uL (ref 4.0–10.5)
nRBC: 0 % (ref 0.0–0.2)

## 2024-06-18 LAB — HCG, QUANTITATIVE, PREGNANCY: hCG, Beta Chain, Quant, S: 146486 m[IU]/mL — ABNORMAL HIGH (ref <5)

## 2024-06-18 LAB — TROPONIN I (HIGH SENSITIVITY): Troponin I (High Sensitivity): <2 ng/L (ref <18)

## 2024-06-18 MED ORDER — ACETAMINOPHEN 500 MG PO TABS
1000.0000 mg | ORAL_TABLET | Freq: Once | ORAL | Status: AC
  Administered 2024-06-18: 1000 mg via ORAL
  Filled 2024-06-18: qty 2

## 2024-06-18 MED ORDER — POTASSIUM CHLORIDE CRYS ER 20 MEQ PO TBCR
40.0000 meq | EXTENDED_RELEASE_TABLET | Freq: Once | ORAL | Status: AC
  Administered 2024-06-18: 40 meq via ORAL
  Filled 2024-06-18: qty 2

## 2024-06-18 MED ORDER — LIDOCAINE 5 % EX PTCH: 1.0000 | MEDICATED_PATCH | Freq: Two times a day (BID) | CUTANEOUS | 0 refills | Status: AC

## 2024-06-18 MED ORDER — LIDOCAINE 5 % EX PTCH
1.0000 | MEDICATED_PATCH | CUTANEOUS | Status: DC
  Administered 2024-06-18: 1 via TRANSDERMAL
  Filled 2024-06-18: qty 1

## 2024-06-18 NOTE — ED Provider Notes (Signed)
 [facility] Provider Note    Event Date/Time   First MD Initiated Contact with Patient 06/18/24 [phone]     (approximate)   History   Chief Complaint Chest Pain and Shortness of Breath   HPI  Felicia Avila is a 28 y.o. female with past medical history of anemia and scoliosis who presents to the ED complaining of chest pain and shortness of breath.  Patient reports that she has had 3 days of cough productive of yellowish sputum with some mild shortness of breath.  She describes subjective fevers and chills as well as discomfort in the middle of her back extending down into her lower back.  She reports some sharp pain in her chest yesterday that has since resolved, has not noticed any pain or swelling in her legs.  She has been feeling nauseous recently but denies any vomiting or diarrhea.  She does report concern that she is possibly pregnant, had a positive test at home but is not sure when her LMP was.     Physical Exam   Triage Vital Signs: ED Triage Vitals  Encounter Vitals Group     BP 06/18/24 0903 121/85     Girls Systolic BP Percentile --      Girls Diastolic BP Percentile --      Boys Systolic BP Percentile --      Boys Diastolic BP Percentile --      Pulse Rate 06/18/24 0903 91     Resp 06/18/24 0903 20     Temp 06/18/24 0903 99.6 F (37.6 C)     Temp src --      SpO2 06/18/24 0903 100 %     Weight 06/18/24 0901 130 lb (59 kg)     Height 06/18/24 0901 5' 4 (1.626 m)     Head Circumference --      Peak Flow --      Pain Score 06/18/24 0901 8     Pain Loc --      Pain Education --      Exclude from Growth Chart --     Most recent vital signs: Vitals:   06/18/24 0903  BP: 121/85  Pulse: 91  Resp: 20  Temp: 99.6 F (37.6 C)  SpO2: 100%    Constitutional: Alert and oriented. Eyes: Conjunctivae are normal. Head: Atraumatic. Nose: No congestion/rhinnorhea. Mouth/Throat: Mucous membranes are moist.  Cardiovascular: Normal  rate, regular rhythm. Grossly normal heart sounds.  2+ radial pulses bilaterally. Respiratory: Normal respiratory effort.  No retractions. Lungs CTAB.  No chest wall tenderness to palpation. Gastrointestinal: Soft and nontender. No distention. Musculoskeletal: No lower extremity tenderness nor edema.  Neurologic:  Normal speech and language. No gross focal neurologic deficits are appreciated.    ED Results / Procedures / Treatments   Labs (all labs ordered are listed, but only abnormal results are displayed) Labs Reviewed  BASIC METABOLIC PANEL WITH GFR - Abnormal; Notable for the following components:      Result Value   Potassium 3.2 (*)    Calcium 8.8 (*)    All other components within normal limits  CBC - Abnormal; Notable for the following components:   Hemoglobin 9.6 (*)    HCT 31.7 (*)    MCV 73.2 (*)    MCH 22.2 (*)    RDW 18.8 (*)    All other components within normal limits  HCG, QUANTITATIVE, PREGNANCY - Abnormal; Notable for the following components:   hCG, Beta Chain,  Earleen RAMAN [phone] (*)    All other components within normal limits  POC URINE PREG, ED - Abnormal; Notable for the following components:   Preg Test, Ur POSITIVE (*)    All other components within normal limits  RESP PANEL BY RT-PCR (RSV, FLU A&B, COVID)  [id number]  HEPATIC FUNCTION PANEL  TROPONIN I (HIGH SENSITIVITY)     EKG  ED ECG REPORT I, Carlin Palin, the attending physician, personally viewed and interpreted this ECG.   Date: 06/18/2024  EKG Time: 9:05  Rate: 95  Rhythm: normal sinus rhythm  Axis: Normal  Intervals:none  ST&T Change: Nonspecific T wave abnormality  RADIOLOGY Chest x-ray reviewed and interpreted by me with no infiltrate, edema, or effusion.  PROCEDURES:  Critical Care performed: No  Procedures   MEDICATIONS ORDERED IN ED: Medications  lidocaine (LIDODERM) 5 % 1 patch (1 patch Transdermal Patch Applied 06/18/24 1102)  acetaminophen (TYLENOL) tablet 1,000 mg  (1,000 mg Oral Given 06/18/24 0948)  potassium chloride  SA (KLOR-CON  M) CR tablet 40 mEq (40 mEq Oral Given 06/18/24 1056)     IMPRESSION / MDM / ASSESSMENT AND PLAN / ED COURSE  I reviewed the triage vital signs and the nursing notes.                              28 y.o. female with past medical history of anemia and scoliosis who presents to the ED complaining of 3 days of productive cough with mild difficulty breathing and pain in her back, had chest pains yesterday that have resolved.  Patient's presentation is most consistent with acute presentation with potential threat to life or bodily function.  Differential diagnosis includes, but is not limited to, ACS, PE, pneumonia, pneumothorax, musculoskeletal pain, GERD, anxiety, bronchitis, COVID-19, anemia, ectopic pregnancy.  Patient nontoxic-appearing and in no acute distress, vital signs are unremarkable.  EKG with nonspecific T wave abnormality but symptoms seem atypical for ACS and more likely related to infectious process with her productive cough.  Chest x-ray is unremarkable, COVID and flu testing is pending.  Troponin also pending, low suspicion for PE at this time given reassuring vital signs and no pleuritic discomfort.  Patient does report pain extending down into her lower back but has a benign abdominal exam.  Pregnancy testing is positive, will check ultrasound and beta-hCG levels.  COVID and flu testing is negative, troponin within normal limits and I doubt ACS or PE.  Beta-hCG levels are appropriately elevated, pelvic ultrasound with intrauterine pregnancy.  Suspect bronchitis or other viral illness, patient appropriate for outpatient management with OB follow-up, referral provided.  She was counseled to return to the ED for new or worsening symptoms, patient agrees with plan.      FINAL CLINICAL IMPRESSION(S) / ED DIAGNOSES   Final diagnoses:  Bronchitis  Back pain complicating pregnancy     Rx / DC Orders   ED  Discharge Orders          Ordered    lidocaine (LIDODERM) 5 %  Every 12 hours        06/18/24 1316             Note:  This document was prepared using Dragon voice recognition software and may include unintentional dictation errors.   Palin Carlin, MD 06/18/24 [phone]

## 2024-06-18 NOTE — ED Triage Notes (Signed)
 Pt to ED for chest pain, back pain, shob started Monday. Congestion noted.

## 2024-06-27 ENCOUNTER — Ambulatory Visit (LOCAL_COMMUNITY_HEALTH_CENTER): Payer: Self-pay

## 2024-06-27 VITALS — BP 119/65 | Ht 64.0 in | Wt 149.5 lb

## 2024-06-27 DIAGNOSIS — Z3201 Encounter for pregnancy test, result positive: Secondary | ICD-10-CM

## 2024-06-27 DIAGNOSIS — Z3009 Encounter for other general counseling and advice on contraception: Secondary | ICD-10-CM | POA: Diagnosis not present

## 2024-06-27 LAB — PREGNANCY, URINE: Preg Test, Ur: POSITIVE — AB

## 2024-06-27 MED ORDER — PRENATAL 27-0.8 MG PO TABS: 1.0000 | ORAL_TABLET | Freq: Every day | ORAL | Status: AC
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
## 2024-07-21 ENCOUNTER — Other Ambulatory Visit: Payer: Self-pay

## 2024-07-21 ENCOUNTER — Emergency Department

## 2024-07-21 ENCOUNTER — Emergency Department
Admission: EM | Admit: 2024-07-21 | Discharge: 2024-07-22 | Disposition: A | Discharge status: Home / Self Care | Attending: Emergency Medicine | Admitting: Emergency Medicine

## 2024-07-21 DIAGNOSIS — Z3A13 13 weeks gestation of pregnancy: Secondary | ICD-10-CM | POA: Diagnosis not present

## 2024-07-21 DIAGNOSIS — D72829 Elevated white blood cell count, unspecified: Secondary | ICD-10-CM | POA: Diagnosis not present

## 2024-07-21 DIAGNOSIS — B9689 Other specified bacterial agents as the cause of diseases classified elsewhere: Secondary | ICD-10-CM

## 2024-07-21 DIAGNOSIS — O23591 Infection of other part of genital tract in pregnancy, first trimester: Secondary | ICD-10-CM | POA: Insufficient documentation

## 2024-07-21 DIAGNOSIS — N83202 Unspecified ovarian cyst, left side: Secondary | ICD-10-CM

## 2024-07-21 DIAGNOSIS — O26891 Other specified pregnancy related conditions, first trimester: Secondary | ICD-10-CM | POA: Diagnosis present

## 2024-07-21 DIAGNOSIS — Z3491 Encounter for supervision of normal pregnancy, unspecified, first trimester: Secondary | ICD-10-CM

## 2024-07-21 DIAGNOSIS — R109 Unspecified abdominal pain: Secondary | ICD-10-CM

## 2024-07-21 LAB — WET PREP, GENITAL
Sperm: NONE SEEN
Trich, Wet Prep: NONE SEEN
WBC, Wet Prep HPF POC: <10 (ref <10)
Yeast Wet Prep HPF POC: NONE SEEN

## 2024-07-21 LAB — COMPREHENSIVE METABOLIC PANEL WITH GFR
ALT: 16 U/L (ref 0–44)
AST: 31 U/L (ref 15–41)
Albumin: 4.2 g/dL (ref 3.5–5.0)
Alkaline Phosphatase: 85 U/L (ref 38–126)
Anion gap: 11 (ref 5–15)
BUN: 11 mg/dL (ref 6–20)
CO2: 23 mmol/L (ref 22–32)
Calcium: 9.2 mg/dL (ref 8.9–10.3)
Chloride: 103 mmol/L (ref 98–111)
Creatinine, Ser: 0.6 mg/dL (ref 0.44–1.00)
GFR, Estimated: >60 mL/min (ref >60)
Glucose, Bld: 87 mg/dL (ref 70–99)
Potassium: 3.6 mmol/L (ref 3.5–5.1)
Sodium: 136 mmol/L (ref 135–145)
Total Bilirubin: 0.2 mg/dL (ref 0.0–1.2)
Total Protein: 8.3 g/dL — ABNORMAL HIGH (ref 6.5–8.1)

## 2024-07-21 LAB — CBC
HCT: 28.3 % — ABNORMAL LOW (ref 36.0–46.0)
Hemoglobin: 8.7 g/dL — ABNORMAL LOW (ref 12.0–15.0)
MCH: 22.3 pg — ABNORMAL LOW (ref 26.0–34.0)
MCHC: 30.7 g/dL (ref 30.0–36.0)
MCV: 72.6 fL — ABNORMAL LOW (ref 80.0–100.0)
Platelets: 400 K/uL (ref 150–400)
RBC: 3.9 MIL/uL (ref 3.87–5.11)
RDW: 19.1 % — ABNORMAL HIGH (ref 11.5–15.5)
WBC: 11 K/uL — ABNORMAL HIGH (ref 4.0–10.5)
nRBC: 0 % (ref 0.0–0.2)

## 2024-07-21 LAB — URINALYSIS, ROUTINE W REFLEX MICROSCOPIC
Bilirubin Urine: NEGATIVE
Glucose, UA: NEGATIVE mg/dL
Hgb urine dipstick: NEGATIVE
Ketones, ur: NEGATIVE mg/dL
Leukocytes,Ua: NEGATIVE
Nitrite: NEGATIVE
Protein, ur: NEGATIVE mg/dL
Specific Gravity, Urine: 1.021 (ref 1.005–1.030)
pH: 6 (ref 5.0–8.0)

## 2024-07-21 LAB — CHLAMYDIA/NGC RT PCR (ARMC ONLY)
Chlamydia Tr: NOT DETECTED
N gonorrhoeae: NOT DETECTED

## 2024-07-21 LAB — HCG, QUANTITATIVE, PREGNANCY: hCG, Beta Chain, Quant, S: 148880 m[IU]/mL — ABNORMAL HIGH (ref <5)

## 2024-07-21 LAB — LIPASE, BLOOD: Lipase: 37 U/L (ref 11–51)

## 2024-07-21 LAB — POC URINE PREG, ED: Preg Test, Ur: POSITIVE — AB

## 2024-07-21 MED ORDER — METRONIDAZOLE 500 MG PO TABS: 500.0000 mg | ORAL_TABLET | Freq: Two times a day (BID) | ORAL | 0 refills | Status: DC

## 2024-07-21 MED ORDER — METRONIDAZOLE 500 MG PO TABS
500.0000 mg | ORAL_TABLET | Freq: Once | ORAL | Status: AC
  Administered 2024-07-21: 500 mg via ORAL
  Filled 2024-07-21: qty 1

## 2024-07-21 NOTE — Discharge Instructions (Signed)
 Please call your [facility] for close follow-up.  Return emergency room right away if you experience persistent or severe abdominal pain, high fever, nausea vomiting, have vaginal bleeding, unusual fluid leak or vaginal discharge, or other concerns arise.

## 2024-07-21 NOTE — ED Triage Notes (Signed)
 Pt reports she has been having intermittent lower abd cramping for the past few days. Pt reports she is aprox [redacted] weeks pregnant but has not seen an OBGYN yet. Pt denies vaginal bleeding.

## 2024-07-21 NOTE — ED Provider Notes (Addendum)
 [facility] Provider Note    Event Date/Time   First MD Initiated Contact with Patient 07/21/24 2053     (approximate)   History   Abdominal Pain   HPI  Felicia Avila is a 28 y.o. female ports 1 previous pregnancy that she had aborted.  This is her second pregnancy.  Last menstrual cycle roughly August 1  About a week now she has had occasional abdominal achiness.  She reports sometimes she will discomfort in her back, she had some pain earlier today that was in her right lower abdomen pointing to her suprapubic region and then reports that sort of moved to the left lower abdomen and now it is gone away.  She has not had any significant vaginal discharge or bleeding.  She does report she had a little bit of discharge a couple weeks ago.  Denies any ongoing abdominal pain or discomfort.  She has had a slight cough as well for about a week and a half, and reports she had bronchitis recently as well and it just seems like it is continue to linger some.  At the present time not any pain or discomfort.  Reports she had some pain earlier today that is now resolved.  Occasionally the pain will radiate towards her back and at 1 point maybe a week ago she had pain in her right upper abdomen but that seems to have gone away  There is no nausea or vomiting there is no fever.  There is no chest pains.  He is following with [facility] and has had ultrasound there of the baby     Physical Exam   Triage Vital Signs: ED Triage Vitals  Encounter Vitals Group     BP 07/21/24 2034 (!) 142/84     Girls Systolic BP Percentile --      Girls Diastolic BP Percentile --      Boys Systolic BP Percentile --      Boys Diastolic BP Percentile --      Pulse Rate 07/21/24 2034 93     Resp 07/21/24 2034 18     Temp 07/21/24 2034 99 F (37.2 C)     Temp src --      SpO2 07/21/24 2034 100 %     Weight 07/21/24 2033 150 lb (68 kg)     Height 07/21/24 2033 5' 4 (1.626 m)     Head  Circumference --      Peak Flow --      Pain Score 07/21/24 2033 7     Pain Loc --      Pain Education --      Exclude from Growth Chart --     Most recent vital signs: Vitals:   07/21/24 2034  BP: (!) 142/84  Pulse: 93  Resp: 18  Temp: 99 F (37.2 C)  SpO2: 100%     General: Awake, no distress.  Very pleasant in no distress CV:  Good peripheral perfusion.  Normal tone Resp:  Normal effort.  Clear bilateral Abd:  No distention.  Soft nontender nondistended.  No right upper quadrant pain negative Murphy.  No pain at McBurney's point.  No pain in any area or pain with palpation at this time.  Patient denies any pain or burning with urination Other:  Patient pain and symptom-free at present time which is reassuring   ED Results / Procedures / Treatments   Labs (all labs ordered are listed, but only abnormal results  are displayed) Labs Reviewed  WET PREP, GENITAL - Abnormal; Notable for the following components:      Result Value   Clue Cells Wet Prep HPF POC PRESENT (*)    All other components within normal limits  COMPREHENSIVE METABOLIC PANEL WITH GFR - Abnormal; Notable for the following components:   Total Protein 8.3 (*)    All other components within normal limits  CBC - Abnormal; Notable for the following components:   WBC 11.0 (*)    Hemoglobin 8.7 (*)    HCT 28.3 (*)    MCV 72.6 (*)    MCH 22.3 (*)    RDW 19.1 (*)    All other components within normal limits  URINALYSIS, ROUTINE W REFLEX MICROSCOPIC - Abnormal; Notable for the following components:   Color, Urine YELLOW (*)    APPearance HAZY (*)    All other components within normal limits  HCG, QUANTITATIVE, PREGNANCY - Abnormal; Notable for the following components:   hCG, Beta Chain, Quant, S 148,880 (*)    All other components within normal limits  POC URINE PREG, ED - Abnormal; Notable for the following components:   Preg Test, Ur Positive (*)    All other components within normal limits   CHLAMYDIA/NGC RT PCR ([facility] ONLY)            LIPASE, BLOOD    RADIOLOGY  Chest x-ray is clear  Right upper quadrant and transvaginal ultrasound pending  I feel unlikely she has acute pneumonia, but given she reports having had bronchitis and still having occasional lingering cough discussed with patient risks benefits and agreeable to proceed with chest x-ray  No chest pain.  No tachycardia no hypoxia.  No lower extremity edema venous cords or congestion no findings or history to be highly concerning for thromboembolism  PROCEDURES:  Critical Care performed: No  Procedures   US  OB Comp < 14 Wks Result Date: 07/21/2024 CLINICAL DATA:  Lower abdominal pain. EXAM: OBSTETRIC <14 WK US  AND TRANSVAGINAL OB US  DOPPLER ULTRASOUND OF OVARIES TECHNIQUE: Both transabdominal and transvaginal ultrasound examinations were performed for complete evaluation of the gestation as well as the maternal uterus, adnexal regions, and pelvic cul-de-sac. Transvaginal technique was performed to assess early pregnancy. Color and duplex Doppler ultrasound was utilized to evaluate blood flow to the ovaries. COMPARISON:  None Available. FINDINGS: Intrauterine gestational sac: Single Yolk sac:  Not Visualized. Embryo:  Visualized. Cardiac Activity: Visualized. Heart Rate: 164 bpm CRL:   65 mm   12 w 6 d                  US  EDC: 01/27/2025 Subchorionic hemorrhage:  None visualized. Maternal uterus/adnexae: Right ovary appears within normal limits. The left ovary is enlarged and measures 4.4 x 3.1 x 3.5 cm. No focal lesion identified in the left ovary. Two uterine fibroids are identified. Left fundal fibroid measures 2.1 x 1.6 x 2.9 cm. Mid left uterine fibroid measures 1.1 x 0.7 x 0.9 cm. There is no free fluid in the pelvis. Pulsed Doppler evaluation of both ovaries demonstrates normal appearing low-resistance arterial and venous waveforms. IMPRESSION: 1. Single live intrauterine gestation measuring 12 weeks 6 days by  crown-rump length. 2. Uterine fibroids are present. 3. The left ovary is prominent in size, but there is normal Doppler flow identified. Electronically Signed   By: Greig Pique M.D.   On: 07/21/2024 23:40   US  PELVIC DOPPLER (TORSION R/O OR MASS ARTERIAL FLOW) Result Date: 07/21/2024 CLINICAL DATA:  Lower abdominal pain. EXAM: OBSTETRIC <14 WK US  AND TRANSVAGINAL OB US  DOPPLER ULTRASOUND OF OVARIES TECHNIQUE: Both transabdominal and transvaginal ultrasound examinations were performed for complete evaluation of the gestation as well as the maternal uterus, adnexal regions, and pelvic cul-de-sac. Transvaginal technique was performed to assess early pregnancy. Color and duplex Doppler ultrasound was utilized to evaluate blood flow to the ovaries. COMPARISON:  None Available. FINDINGS: Intrauterine gestational sac: Single Yolk sac:  Not Visualized. Embryo:  Visualized. Cardiac Activity: Visualized. Heart Rate: 164 bpm CRL:   65 mm   12 w 6 d                  US  EDC: 01/27/2025 Subchorionic hemorrhage:  None visualized. Maternal uterus/adnexae: Right ovary appears within normal limits. The left ovary is enlarged and measures 4.4 x 3.1 x 3.5 cm. No focal lesion identified in the left ovary. Two uterine fibroids are identified. Left fundal fibroid measures 2.1 x 1.6 x 2.9 cm. Mid left uterine fibroid measures 1.1 x 0.7 x 0.9 cm. There is no free fluid in the pelvis. Pulsed Doppler evaluation of both ovaries demonstrates normal appearing low-resistance arterial and venous waveforms. IMPRESSION: 1. Single live intrauterine gestation measuring 12 weeks 6 days by crown-rump length. 2. Uterine fibroids are present. 3. The left ovary is prominent in size, but there is normal Doppler flow identified. Electronically Signed   By: Greig Pique M.D.   On: 07/21/2024 23:40   US  ABDOMEN LIMITED RUQ (LIVER/GB) Result Date: 07/21/2024 CLINICAL DATA:  Pregnant patient presenting with right upper quadrant pain. EXAM: ULTRASOUND  ABDOMEN LIMITED RIGHT UPPER QUADRANT COMPARISON:  None Available. FINDINGS: Gallbladder: No gallstones or wall thickening visualized (2.2 mm). No sonographic Murphy sign noted by sonographer. Common bile duct: Diameter: 3.6 mm Liver: No focal lesion identified. Within normal limits in parenchymal echogenicity. Portal vein is patent on color Doppler imaging with normal direction of blood flow towards the liver. Other: None. IMPRESSION: Unremarkable right upper quadrant ultrasound. Electronically Signed   By: Suzen Dials M.D.   On: 07/21/2024 23:19   DG Chest 2 View Result Date: 07/21/2024 CLINICAL DATA:  Cough EXAM: DG CHEST 2V COMPARISON:  Chest x-ray 06/18/2024 FINDINGS: The heart size and mediastinal contours are within normal limits. Both lungs are clear. The visualized skeletal structures are unremarkable. IMPRESSION: No active cardiopulmonary disease. Electronically Signed   By: Greig Pique M.D.   On: 07/21/2024 21:57     MEDICATIONS ORDERED IN ED: Medications  metroNIDAZOLE  (FLAGYL ) tablet 500 mg (500 mg Oral Given 07/21/24 2328)     IMPRESSION / MDM / ASSESSMENT AND PLAN / ED COURSE  I reviewed the triage vital signs and the nursing notes.                              Differential diagnosis includes, but is not limited to, possible pregnancy related pain, ovarian cyst, urinary tract infection, given the right upper quadrant pain somewhat remotely with back pain will obtain right upper quadrant ultrasound.  Very reassuring abdominal exam at this time.  Currently no active pain or discomfort.  Not febrile.  Discussed with patient, she is comfortable providing urine sample and self swab for wet prep.  Overall no symptoms suggestive of serious acute intra-abdominal concern at this point but given some associated crampy or other abdominal pain earlier today we will proceed with ultrasound to check on fetal health and evaluate for ovarian cyst or  acute GYN process  Patient's  presentation is most consistent with acute complicated illness / injury requiring diagnostic workup.      Clinical Course as of 07/22/24 0003  Thu Jul 21, 2024  2054 Patient reports last menstrual period started August 1 putting her at roughly 15 weeks by dating of LMP [MQ]  2110 Patient under care of [facility] 07/01/2024 4:05 PM EDT  ============  An early intrauterine singleton pregnancy is visualized. Anatomic survey is precluded by early gestational age.  There is a small avascular cystic structure opposite the placenta that measures less than 1cm. The etiology of this is unclear but it is unlikely to affect the pregnancy and likely to resolve by next US .  Two small pedunculated fibroids are seen.  Thank you for this referral.  [MQ]  2253 Labs demonstrate anemia with low MCV. [MQ]  2253 Also noticed a mild leukocytosis, nonspecific, can be seen in pregnancy.  Awaiting urinalysis.  At this point though with patient asymptomatic no pain, seems unlikely to represent acute infectious cause.  Although it is noted she has clue cells [MQ]  2322 Will start Flagyl  500 mg twice daily for 7 days.  Suspicion for bacterial vaginosis [MQ]  2322 Dr. Cyrena to follow-up on pending RUQ US  and TVUS.  [MQ]  2323 GC also pending, follow-up on result (if available), otherwise ED RN naviator will follow GC result if long return time [MQ]  Fri Jul 22, 2024  0000 Patient resting comfortably reviewed results.  Will prescribe Flagyl .  Did change to a different pharmacy now.  She reports only discomfort right now is from the IV in her right forearm otherwise no pain discomfort.  She will continue to follow with [facility] [MQ]  0000 Discussed careful return precautions patient agreeable ready for discharge. [MQ]  0002 Discussed fibroids noted on ultrasound with patient she reports already aware OB aware.  She does have anemia this appears to be chronic with microcytic component.  No clinical history to suggest  bleeding.  Also noted to be chronic.  Left ovarian cyst is noted but she has no associated pain at this time no left sided abdominal pain or otherwise only discomfort right now is from the IV in her right forearm which appears well and not infiltrated which will be removed shortly.  Comfortable plan for discharge and careful return precautions including discussed if she develops fevers or severe pain left flank pain deep pain bleeding or other concerns return to ER right away [MQ]    Clinical Course User Index [MQ] Dicky Anes, MD   ----------------------------------------- 11:27 PM on 07/21/2024 ----------------------------------------- Ongoing care assigned to Dr. Cyrena.  Have initiated treatment for bacterial vaginosis.  Pending results of GC, chlamydia, right upper quadrant transvaginal ultrasound.  FINAL CLINICAL IMPRESSION(S) / ED DIAGNOSES   Final diagnoses:  Bacterial vaginosis  First trimester pregnancy  Abdominal pain in early pregnancy  Ovarian cyst, left     Rx / DC Orders   ED Discharge Orders          Ordered    metroNIDAZOLE  (FLAGYL ) 500 MG tablet  2 times daily        07/22/24 0001    metroNIDAZOLE  (FLAGYL ) 500 MG tablet  2 times daily,   Status:  Discontinued        07/21/24 2326             Note:  This document was prepared using Dragon voice recognition software and may include unintentional dictation errors.  Dicky Anes, MD 07/21/24 7672    Dicky Anes, MD 07/22/24 [phone]

## 2024-07-22 MED ORDER — METRONIDAZOLE 500 MG PO TABS: 500.0000 mg | ORAL_TABLET | Freq: Two times a day (BID) | ORAL | 0 refills | Status: AC

## 2024-08-01 LAB — PANORAMA PRENATAL TEST FULL PANEL:PANORAMA TEST PLUS 5 ADDITIONAL MICRODELETIONS: FETAL FRACTION: 7.5

## 2024-08-09 NOTE — Telephone Encounter (Signed)
-----   Message from [facility] [place] sent at 08/09/2024 12:00 PM EST ----- Regarding: anemia Hi,  Can you call Felicia Avila and let her know she has anemia (with low ferritin too) and educate her about supplements?  I sent her a MyChart message but previously she's not good about reading them.   Thanks! Clotilda

## 2024-08-09 NOTE — Telephone Encounter (Signed)
 Patient informed. Verbalized understanding. Iron information sent via Mychart per pt request.

## 2024-08-11 ENCOUNTER — Emergency Department
Admission: EM | Admit: 2024-08-11 | Discharge: 2024-08-12 | Disposition: A | Discharge status: Home / Self Care | Attending: Emergency Medicine | Admitting: Emergency Medicine

## 2024-08-11 ENCOUNTER — Other Ambulatory Visit: Payer: Self-pay

## 2024-08-11 ENCOUNTER — Emergency Department

## 2024-08-11 DIAGNOSIS — O368120 Decreased fetal movements, second trimester, not applicable or unspecified: Secondary | ICD-10-CM | POA: Insufficient documentation

## 2024-08-11 DIAGNOSIS — Z3A16 16 weeks gestation of pregnancy: Secondary | ICD-10-CM | POA: Insufficient documentation

## 2024-08-11 DIAGNOSIS — Z3492 Encounter for supervision of normal pregnancy, unspecified, second trimester: Secondary | ICD-10-CM

## 2024-08-11 LAB — URINALYSIS, ROUTINE W REFLEX MICROSCOPIC
Bilirubin Urine: NEGATIVE
Glucose, UA: NEGATIVE mg/dL
Hgb urine dipstick: NEGATIVE
Ketones, ur: NEGATIVE mg/dL
Leukocytes,Ua: NEGATIVE
Nitrite: NEGATIVE
Protein, ur: NEGATIVE mg/dL
Specific Gravity, Urine: 1.02 (ref 1.005–1.030)
pH: 6 (ref 5.0–8.0)

## 2024-08-11 LAB — CBC WITH DIFFERENTIAL/PLATELET
Abs Immature Granulocytes: 0.07 K/uL (ref 0.00–0.07)
Basophils Absolute: 0.1 K/uL (ref 0.0–0.1)
Basophils Relative: 1 %
Eosinophils Absolute: 0.4 K/uL (ref 0.0–0.5)
Eosinophils Relative: 4 %
HCT: 27.4 % — ABNORMAL LOW (ref 36.0–46.0)
Hemoglobin: 8.4 g/dL — ABNORMAL LOW (ref 12.0–15.0)
Immature Granulocytes: 1 %
Lymphocytes Relative: 24 %
Lymphs Abs: 2.7 K/uL (ref 0.7–4.0)
MCH: 22.6 pg — ABNORMAL LOW (ref 26.0–34.0)
MCHC: 30.7 g/dL (ref 30.0–36.0)
MCV: 73.9 fL — ABNORMAL LOW (ref 80.0–100.0)
Monocytes Absolute: 1.1 K/uL — ABNORMAL HIGH (ref 0.1–1.0)
Monocytes Relative: 10 %
Neutro Abs: 6.9 K/uL (ref 1.7–7.7)
Neutrophils Relative %: 60 %
Platelets: 443 K/uL — ABNORMAL HIGH (ref 150–400)
RBC: 3.71 MIL/uL — ABNORMAL LOW (ref 3.87–5.11)
RDW: 20.8 % — ABNORMAL HIGH (ref 11.5–15.5)
WBC: 11.2 K/uL — ABNORMAL HIGH (ref 4.0–10.5)
nRBC: 0 % (ref 0.0–0.2)

## 2024-08-11 LAB — BASIC METABOLIC PANEL WITH GFR
Anion gap: 12 (ref 5–15)
BUN: 9 mg/dL (ref 6–20)
CO2: 22 mmol/L (ref 22–32)
Calcium: 9.4 mg/dL (ref 8.9–10.3)
Chloride: 101 mmol/L (ref 98–111)
Creatinine, Ser: 0.52 mg/dL (ref 0.44–1.00)
GFR, Estimated: >60 mL/min (ref >60)
Glucose, Bld: 97 mg/dL (ref 70–99)
Potassium: 3.6 mmol/L (ref 3.5–5.1)
Sodium: 135 mmol/L (ref 135–145)

## 2024-08-11 LAB — HCG, QUANTITATIVE, PREGNANCY: hCG, Beta Chain, Quant, S: 71510 m[IU]/mL — ABNORMAL HIGH (ref <5)

## 2024-08-11 NOTE — ED Triage Notes (Signed)
 Pt reports she is aprox [redacted] weeks pregnant and for the past week has had decreased fetal movement and pt has been losing weight. Pt denies abd cramping or vaginal bleeding. Pt has not seen OB for these symptoms.

## 2024-08-12 NOTE — Discharge Instructions (Addendum)
 Your workup today shows a reassuring 16-week pregnancy with no noted abnormalities.  Please follow-up with your OB/GYN.  Return to the emergency department for any abdominal pain any vaginal bleeding, fluid leakage, or any other symptom personally concerning to yourself.

## 2024-08-12 NOTE — ED Provider Notes (Signed)
° °  [facility] Provider Note    Event Date/Time   First MD Initiated Contact with Patient 08/11/24 2334     (approximate)  History   Chief Complaint: Decreased Fetal Movement  HPI  Felicia Avila is a 28 y.o. female approximately [redacted] weeks pregnant who presents to the emergency department for decreased fetal movement.  According to the patient she had been feeling the baby move however over the past week or so she has not been feeling it.  Patient denies any abdominal pain.  Denies any vaginal bleeding or fluid leakage.  Physical Exam   Triage Vital Signs: ED Triage Vitals  Encounter Vitals Group     BP 08/11/24 2036 121/79     Girls Systolic BP Percentile --      Girls Diastolic BP Percentile --      Boys Systolic BP Percentile --      Boys Diastolic BP Percentile --      Pulse Rate 08/11/24 2036 87     Resp 08/11/24 2036 17     Temp 08/11/24 2036 98.6 F (37 C)     Temp Source 08/11/24 2036 Oral     SpO2 08/11/24 2036 100 %     Weight 08/11/24 2035 150 lb (68 kg)     Height 08/11/24 2035 5' 4 (1.626 m)     Head Circumference --      Peak Flow --      Pain Score 08/11/24 2035 0     Pain Loc --      Pain Education --      Exclude from Growth Chart --     Most recent vital signs: Vitals:   08/11/24 2036  BP: 121/79  Pulse: 87  Resp: 17  Temp: 98.6 F (37 C)  SpO2: 100%    General: Awake, no distress.  CV:  Good peripheral perfusion.  Resp:  Normal effort.  Abd:  No distention.  ED Results / Procedures / Treatments   RADIOLOGY  Ultrasound shows a single live intrauterine pregnancy at 16 weeks and 3 days fetal heart rate of 158.   MEDICATIONS ORDERED IN ED: Medications - No data to display   IMPRESSION / MDM / ASSESSMENT AND PLAN / ED COURSE  I reviewed the triage vital signs and the nursing notes.  Patient's presentation is most consistent with acute presentation with potential threat to life or bodily  function.  Patient presents to the emergency department for decreased fetal movement x 1 week.  Overall the patient appears very well.  No distress.  Reassuring workup including a reassuring CBC chronic anemia, reassuring chemistry, beta-hCG of 71,000.  Urinalysis shows no concerning findings.  Patient's ultrasound has resulted showing a 16-week 3-day intrauterine pregnancy with a normal heart rate.  Given the patient's reassuring ER workup I believe the patient safe for discharge home with OB follow-up.  Patient is agreeable to this plan and is reassured by her workup today.  FINAL CLINICAL IMPRESSION(S) / ED DIAGNOSES   Decreased fetal movement Second trimester pregnancy   Note:  This document was prepared using Dragon voice recognition software and may include unintentional dictation errors.   Dorothyann Drivers, MD 08/12/24 [phone]

## 2024-10-02 ENCOUNTER — Emergency Department
Admission: EM | Admit: 2024-10-02 | Discharge: 2024-10-02 | Discharge status: Against Medical Advice | Source: Home / Self Care
